# Patient Record
Sex: Female | Born: 1935 | Race: White | Hispanic: No | Marital: Married | Smoking: Former smoker
Health system: Southern US, Community
[De-identification: ages and names within clinical notes are randomized; demographics above are authoritative.]

## PROBLEM LIST (undated history)

## (undated) DIAGNOSIS — M1990 Unspecified osteoarthritis, unspecified site: Secondary | ICD-10-CM

## (undated) DIAGNOSIS — J3089 Other allergic rhinitis: Secondary | ICD-10-CM

## (undated) DIAGNOSIS — J449 Chronic obstructive pulmonary disease, unspecified: Secondary | ICD-10-CM

## (undated) DIAGNOSIS — E079 Disorder of thyroid, unspecified: Secondary | ICD-10-CM

## (undated) DIAGNOSIS — E785 Hyperlipidemia, unspecified: Secondary | ICD-10-CM

## (undated) DIAGNOSIS — G629 Polyneuropathy, unspecified: Secondary | ICD-10-CM

## (undated) DIAGNOSIS — I10 Essential (primary) hypertension: Secondary | ICD-10-CM

## (undated) HISTORY — PX: ABDOMINAL HYSTERECTOMY: SHX81

## (undated) HISTORY — DX: Disorder of thyroid, unspecified: E07.9

## (undated) HISTORY — DX: Hyperlipidemia, unspecified: E78.5

## (undated) HISTORY — DX: Essential (primary) hypertension: I10

## (undated) HISTORY — PX: APPENDECTOMY: SHX54

## (undated) HISTORY — DX: Chronic obstructive pulmonary disease, unspecified: J44.9

## (undated) HISTORY — DX: Polyneuropathy, unspecified: G62.9

## (undated) HISTORY — PX: TONSILLECTOMY AND ADENOIDECTOMY: SUR1326

## (undated) HISTORY — DX: Other allergic rhinitis: J30.89

## (undated) HISTORY — DX: Unspecified osteoarthritis, unspecified site: M19.90

## (undated) HISTORY — PX: CHOLECYSTECTOMY: SHX55

---

## 2014-05-01 HISTORY — PX: REPLACEMENT TOTAL KNEE: SUR1224

## 2017-11-22 ENCOUNTER — Encounter (INDEPENDENT_AMBULATORY_CARE_PROVIDER_SITE_OTHER): Payer: Self-pay

## 2017-11-22 ENCOUNTER — Other Ambulatory Visit: Payer: Self-pay | Admitting: Primary Care

## 2017-11-22 ENCOUNTER — Ambulatory Visit: Payer: Medicare Other | Admitting: Primary Care

## 2017-11-22 ENCOUNTER — Encounter: Payer: Self-pay | Admitting: Primary Care

## 2017-11-22 VITALS — BP 148/82 | HR 59 | Temp 97.4°F | Ht 63.5 in | Wt 156.2 lb

## 2017-11-22 DIAGNOSIS — I10 Essential (primary) hypertension: Secondary | ICD-10-CM

## 2017-11-22 DIAGNOSIS — E785 Hyperlipidemia, unspecified: Secondary | ICD-10-CM | POA: Diagnosis not present

## 2017-11-22 DIAGNOSIS — E039 Hypothyroidism, unspecified: Secondary | ICD-10-CM

## 2017-11-22 DIAGNOSIS — G629 Polyneuropathy, unspecified: Secondary | ICD-10-CM

## 2017-11-22 DIAGNOSIS — M150 Primary generalized (osteo)arthritis: Secondary | ICD-10-CM

## 2017-11-22 DIAGNOSIS — Z1239 Encounter for other screening for malignant neoplasm of breast: Secondary | ICD-10-CM

## 2017-11-22 DIAGNOSIS — Z1231 Encounter for screening mammogram for malignant neoplasm of breast: Secondary | ICD-10-CM

## 2017-11-22 DIAGNOSIS — M159 Polyosteoarthritis, unspecified: Secondary | ICD-10-CM

## 2017-11-22 DIAGNOSIS — M1990 Unspecified osteoarthritis, unspecified site: Secondary | ICD-10-CM | POA: Insufficient documentation

## 2017-11-22 DIAGNOSIS — M8949 Other hypertrophic osteoarthropathy, multiple sites: Secondary | ICD-10-CM

## 2017-11-22 LAB — LIPID PANEL
CHOL/HDL RATIO: 3
Cholesterol: 145 mg/dL (ref 0–200)
HDL: 47.6 mg/dL (ref >39.00)
LDL Cholesterol: 79 mg/dL (ref 0–99)
NONHDL: 97.2
TRIGLYCERIDES: 90 mg/dL (ref 0.0–149.0)
VLDL: 18 mg/dL (ref 0.0–40.0)

## 2017-11-22 LAB — COMPREHENSIVE METABOLIC PANEL
ALT: 16 U/L (ref 0–35)
AST: 15 U/L (ref 0–37)
Albumin: 4.1 g/dL (ref 3.5–5.2)
Alkaline Phosphatase: 61 U/L (ref 39–117)
BILIRUBIN TOTAL: 0.4 mg/dL (ref 0.2–1.2)
BUN: 20 mg/dL (ref 6–23)
CALCIUM: 9.1 mg/dL (ref 8.4–10.5)
CO2: 29 meq/L (ref 19–32)
CREATININE: 0.54 mg/dL (ref 0.40–1.20)
Chloride: 107 mEq/L (ref 96–112)
GFR: 114.48 mL/min (ref >60.00)
Glucose, Bld: 96 mg/dL (ref 70–99)
Potassium: 3.9 mEq/L (ref 3.5–5.1)
Sodium: 142 mEq/L (ref 135–145)
TOTAL PROTEIN: 7 g/dL (ref 6.0–8.3)

## 2017-11-22 LAB — TSH: TSH: 0.34 u[IU]/mL — ABNORMAL LOW (ref 0.35–4.50)

## 2017-11-22 NOTE — Assessment & Plan Note (Signed)
Borderline high on recheck. Endorses compliance to losartan.  Will have her start monitoring BP at home, report readings at or above 150/90. BMP pending.

## 2017-11-22 NOTE — Assessment & Plan Note (Signed)
Located to bilateral knees and also right shoulder. Discussed treatment for shoulder including PT vs Sports Med eval, she opts for sports med eval first. She is open to PT. She will schedule an appointment.

## 2017-11-22 NOTE — Progress Notes (Signed)
Subjective:    Patient ID: Michelle Ball, female    DOB: 06-Feb-1933, 84 y.o.   MRN: [id number]  HPI  [name] is an 84 year old female who presents today to establish care and discuss the problems mentioned below. Will obtain old records. Her last physical was in December 2018. She due for her mammogram.   1) Hypothyroidism: Diagnosed during early teenage years. Currently managed on levothyroxine 88 mcg. Her TSH was checked last December 2018. Her last dose change was in 2010.  2) Hyperlipidemia: Currently managed on atorvastatin 20 mg. Her last lipid panel was in December 2018. She thinks it was normal.  3) Essential Hypertension: Currently managed on losartan 100 mg. She does not check her BP at home. She doesn't think her BP is ever this high. She denies chest pain, dizziness.   BP Readings from Last 3 Encounters:  11/22/17 (!) 152/94   4) Neuropathy: Currently managed on Lyrica 150 mg BID. She was following with neurology in [place]. She was once managed on Gabapentin in the past which didn't help. Overall she feels well managed.   5) Osteoarthritis: Located to her right shoulder and bilateral knees. Diagnosed with "frozen shoulder" in September 2018. She has decrease in ROM with pain when dressing, stretching, sleeping on her right side. She is taking Meloxicam nightly with improvement. She has a handicap placard for which is needing renewal. Due to arthritis she is unable to walk long distances.   Review of Systems  Eyes: Negative for visual disturbance.  Respiratory: Negative for shortness of breath.   Cardiovascular: Negative for chest pain.  Musculoskeletal: Positive for arthralgias.       Chronic right shoulder pain  Neurological: Negative for dizziness and headaches.       Neuropathy        Past Medical History:  Diagnosis Date  . Allergy   . Arthritis   . COPD (chronic obstructive pulmonary disease) ([place])   . Hyperlipidemia   . Hypertension   .  Thyroid disease      Social History   Socioeconomic History  . Marital status: Married    Spouse name: Not on file  . Number of children: Not on file  . Years of education: Not on file  . Highest education level: Not on file  Occupational History  . Not on file  Social Needs  . Financial resource strain: Not on file  . Food insecurity:    Worry: Not on file    Inability: Not on file  . Transportation needs:    Medical: Not on file    Non-medical: Not on file  Tobacco Use  . Smoking status: Former Research scientist (life sciences)  . Smokeless tobacco: Never Used  Substance and Sexual Activity  . Alcohol use: Never    Frequency: Never  . Drug use: Never  . Sexual activity: Not Currently  Lifestyle  . Physical activity:    Days per week: Not on file    Minutes per session: Not on file  . Stress: Not on file  Relationships  . Social connections:    Talks on phone: Not on file    Gets together: Not on file    Attends religious service: Not on file    Active member of club or organization: Not on file    Attends meetings of clubs or organizations: Not on file    Relationship status: Not on file  . Intimate partner violence:    Fear of current or ex  partner: Not on file    Emotionally abused: Not on file    Physically abused: Not on file    Forced sexual activity: Not on file  Other Topics Concern  . Not on file  Social History Narrative  . Not on file      Family History  Problem Relation Age of Onset  . Arthritis Mother   . Cancer Mother   . Arthritis Father   . Early death Father   . Heart disease Father   . Heart attack Father   . Hyperlipidemia Father   . Hypertension Father   . Arthritis Sister   . Depression Sister   . Hypertension Sister   . Hyperlipidemia Sister     No Known Allergies  Current Outpatient Medications on File Prior to Visit  Medication Sig Dispense Refill  . atorvastatin (LIPITOR) 20 MG tablet Take 20 mg by mouth daily.  0  . levothyroxine  (SYNTHROID, LEVOTHROID) 88 MCG tablet Take 88 mcg by mouth daily.    Marland Kitchen losartan (COZAAR) 100 MG tablet Take 100 mg by mouth daily.  0  . LYRICA 150 MG capsule Take 150 mg by mouth daily.  1  . meloxicam (MOBIC) 15 MG tablet TK 1 T PO QD WF PRN  1   No current facility-administered medications on file prior to visit.     BP (!) 148/82   Pulse (!) 59   Temp (!) 97.4 F (36.3 C) (Oral)   Ht 5' 3.5" (1.613 m)   Wt 156 lb 4 oz (70.9 kg)   SpO2 97%   BMI 27.24 kg/m    Objective:   Physical Exam  Constitutional: She appears well-nourished.  Neck: Neck supple.  Cardiovascular: Normal rate and regular rhythm.  Respiratory: Effort normal and breath sounds normal.  Musculoskeletal:       Right shoulder: She exhibits decreased range of motion and pain. She exhibits no tenderness.  Decrease in ROM with pain in most planes.   Skin: Skin is warm and dry.  Psychiatric: She has a normal mood and affect.           Assessment & Plan:

## 2017-11-22 NOTE — Assessment & Plan Note (Signed)
Compliant to atorvastatin, lipid panel pending. Continue same.  

## 2017-11-22 NOTE — Assessment & Plan Note (Signed)
TSH pending. Continue levothyroxine 88 mcg for now.

## 2017-11-22 NOTE — Assessment & Plan Note (Signed)
Chronic for years, doing well on Lyrica 150 mg BID. No improvement with gabapentin. Continue same.

## 2017-11-22 NOTE — Patient Instructions (Signed)
Start monitoring your blood pressure daily, around the same time of day, for the next several weeks.  Ensure that you have rested for 30 minutes prior to checking your blood pressure. Record your readings and I'll call you for those readings in 2 weeks.  Your blood pressure should run less than 150/90.   Stop by the lab prior to leaving today. I will notify you of your results once received.   Schedule an appointment with Dr. Lorelei Pont for evaluation of your shoulder.   Please schedule a physical with me in December this year. You may also schedule a lab only appointment 3-4 days prior. We will discuss your lab results in detail during your physical.  It was a pleasure to meet you today! Please don't hesitate to call or message me with any questions. Welcome to [facility]!

## 2017-11-26 ENCOUNTER — Telehealth: Payer: Self-pay | Admitting: Primary Care

## 2017-11-26 NOTE — Telephone Encounter (Signed)
Rec'd from [facility] forward  31 pages to Dr. Alma Friendly

## 2017-11-28 ENCOUNTER — Encounter: Payer: Self-pay | Admitting: Family Medicine

## 2017-11-28 ENCOUNTER — Ambulatory Visit: Payer: Medicare Other | Admitting: Family Medicine

## 2017-11-28 VITALS — BP 158/70 | HR 71 | Temp 98.4°F | Ht 63.5 in | Wt 158.0 lb

## 2017-11-28 DIAGNOSIS — M25512 Pain in left shoulder: Secondary | ICD-10-CM | POA: Diagnosis not present

## 2017-11-28 DIAGNOSIS — M19012 Primary osteoarthritis, left shoulder: Secondary | ICD-10-CM | POA: Diagnosis not present

## 2017-11-28 MED ORDER — METHYLPREDNISOLONE ACETATE 40 MG/ML IJ SUSP
80.0000 mg | Freq: Once | INTRAMUSCULAR | Status: AC
  Administered 2017-11-28: 80 mg via INTRA_ARTICULAR

## 2017-11-28 NOTE — Progress Notes (Signed)
Dr. Frederico Hamman T. Shavette Shoaff, MD, [facility] [place], [address] Phone: [phone] Fax: [phone]  11/28/2017  Patient: Michelle Ball, MRN: [id number], DOB: 02/10/1933, 84 y.o.  Primary Physician:  Pleas Koch, NP   Chief Complaint  Patient presents with  . Shoulder Pain    Pain  . Ears Popping   Subjective:   Michelle Ball is a 84 y.o. very pleasant female patient who presents with the following:  R shoulder, GH OA.  Pleasant 84 year old who is recently moved to the region from [place], and she was previously evaluated in [facility] when she was having some pain in her right shoulder.  At that point she had both a traditional shoulder series as well as a CT of her shoulder, and this showed that the patient had some moderate glenohumeral arthritis.  There was no evidence of neoplasm, and she has not had any known injury at all.  Only intervention she has had is some meloxicam at 7.5 mg, no injections, no physical therapy, no surgery.  Had a cancer removed but has some degenerative pain and pain in the R shoulder and limited motion. Hurts if hurts to move further. Has not done too much.  R GH injection  Past Medical History, Surgical History, Social History, Family History, Problem List, Medications, and Allergies have been reviewed and updated if relevant.  Patient Active Problem List   Diagnosis Date Noted  . Hypothyroidism 11/22/2017  . Hyperlipidemia 11/22/2017  . Essential hypertension 11/22/2017  . Neuropathy 11/22/2017  . Osteoarthritis 11/22/2017    Past Medical History:  Diagnosis Date  . Arthritis   . COPD (chronic obstructive pulmonary disease) ([place])   . Environmental and seasonal allergies   . Hyperlipidemia   . Hypertension   . Neuropathy   . Thyroid disease     Past Surgical History:  Procedure Laterality Date  . ABDOMINAL HYSTERECTOMY    . APPENDECTOMY      . CHOLECYSTECTOMY    . REPLACEMENT TOTAL KNEE Right 2016  . TONSILLECTOMY AND ADENOIDECTOMY      Social History   Socioeconomic History  . Marital status: Married    Spouse name: Not on file  . Number of children: Not on file  . Years of education: Not on file  . Highest education level: Not on file  Occupational History  . Not on file  Social Needs  . Financial resource strain: Not on file  . Food insecurity:    Worry: Not on file    Inability: Not on file  . Transportation needs:    Medical: Not on file    Non-medical: Not on file  Tobacco Use  . Smoking status: Former Research scientist (life sciences)  . Smokeless tobacco: Never Used  Substance and Sexual Activity  . Alcohol use: Never    Frequency: Never  . Drug use: Never  . Sexual activity: Not Currently  Lifestyle  . Physical activity:    Days per week: Not on file    Minutes per session: Not on file  . Stress: Not on file  Relationships  . Social connections:    Talks on phone: Not on file    Gets together: Not on file    Attends religious service: Not on file    Active member of club or organization: Not on file    Attends meetings of clubs or organizations: Not on file    Relationship status: Not on file  .  Intimate partner violence:    Fear of current or ex partner: Not on file    Emotionally abused: Not on file    Physically abused: Not on file    Forced sexual activity: Not on file  Other Topics Concern  . Not on file  Social History Narrative  . Not on file    Family History  Problem Relation Age of Onset  . Arthritis Mother   . Cancer Mother   . Arthritis Father   . Early death Father   . Heart disease Father   . Heart attack Father   . Hyperlipidemia Father   . Hypertension Father   . Arthritis Sister   . Depression Sister   . Hypertension Sister   . Hyperlipidemia Sister     No Known Allergies  Medication list reviewed and updated in full in [place].  GEN: No fevers, chills. Nontoxic.  Primarily MSK c/o today. MSK: Detailed in the HPI GI: tolerating PO intake without difficulty Neuro: No numbness, parasthesias, or tingling associated. Otherwise the pertinent positives of the ROS are noted above.   Objective:   BP (!) 158/70   Pulse 71   Temp 98.4 F (36.9 C) (Oral)   Ht 5' 3.5" (1.613 m)   Wt 158 lb (71.7 kg)   BMI 27.55 kg/m    GEN: WDWN, NAD, Non-toxic, Alert & Oriented x 3 HEENT: Atraumatic, Normocephalic.  Ears and Nose: No external deformity. EXTR: No clubbing/cyanosis/edema NEURO: Normal gait.  PSYCH: Normally interactive. Conversant. Not depressed or anxious appearing.  Calm demeanor.   Shoulder: R and L Inspection: No muscle wasting or winging Ecchymosis/edema: neg  AC joint, scapula, clavicle: NT Cervical spine: NT, full ROM Spurling's: neg ABNORMAL SIDE TESTED: R UNLESS OTHERWISE NOTED, THE CONTRALATERAL SIDE HAS FULL RANGE OF MOTION. Abduction: 5/5, LIMITED TO 140 DEGREES Flexion: 5/5, LIMITED TO 140 DEGNO ROM  IR, lift-off: 5/5. TESTED AT 90 DEGREES OF ABDUCTION, LIMITED TO 0 DEGREES ER at neutral:  5/5, TESTED AT 90 DEGREES OF ABDUCTION, LIMITED TO 80 DEGREES AC crossover and compression: PAIN Drop Test: neg Empty Can: neg Supraspinatus insertion: NT Bicipital groove: NT ALL OTHER SPECIAL TESTING EQUIVOCAL GIVEN LOSS OF MOTION C5-T1 intact Sensation intact Grip 5/5    Radiology: No results found.  Assessment and Plan:   Osteoarthritis of glenohumeral joint, left  Left shoulder pain, unspecified chronicity - Plan: methylPREDNISolone acetate (DEPO-MEDROL) injection 80 mg  I do not have the films for my independent review, but I think that you can assume that the reading radiologist and orthopedic surgeons would be able to define the patient's glenohumeral osteoarthritis.  It is my assumption based on her distribution of pain and films that this is the likely cause of her loss of motion as well as pain deep in the glenohumeral  joint.  She has some loss of motion, which is often seen in glenohumeral arthritis.  A component of adhesive capsulitis may be present.  [facility] protocol given for ROM.  Intrarticular Shoulder Injection, LEFT Verbal consent was obtained from the patient. Risks including infection explained and contrasted with benefits and alternatives. Patient prepped with Chloraprep and Ethyl Chloride used for anesthesia. An intraarticular shoulder injection was performed using the posterior approach. The patient tolerated the procedure well and had decreased pain post injection. No complications. Injection: 8 cc of Lidocaine 1% and 2 mL Depo-Medrol 40 mg. Needle: 22 gauge 2 inch  Follow-up: 2 months if needed  Meds ordered this encounter  Medications  .  methylPREDNISolone acetate (DEPO-MEDROL) injection 80 mg   Signed,  Latash Nouri T. Elaura Calix, MD   Allergies as of 11/28/2017   No Known Allergies     Medication List        Accurate as of 11/28/17  2:24 PM. Always use your most recent med list.          atorvastatin 20 MG tablet Commonly known as:  LIPITOR Take 20 mg by mouth daily.   levothyroxine 88 MCG tablet Commonly known as:  SYNTHROID, LEVOTHROID Take 88 mcg by mouth daily.   losartan 100 MG tablet Commonly known as:  COZAAR Take 100 mg by mouth daily.   LYRICA 150 MG capsule Generic drug:  pregabalin Take 150 mg by mouth daily.   meloxicam 15 MG tablet Commonly known as:  MOBIC TK 1 T PO QD WF PRN

## 2017-12-14 ENCOUNTER — Ambulatory Visit
Admission: RE | Admit: 2017-12-14 | Discharge: 2017-12-14 | Disposition: A | Discharge status: Home / Self Care | Payer: Medicare Other | Source: Ambulatory Visit | Attending: Primary Care | Admitting: Primary Care

## 2017-12-14 DIAGNOSIS — Z1231 Encounter for screening mammogram for malignant neoplasm of breast: Secondary | ICD-10-CM | POA: Insufficient documentation

## 2017-12-14 DIAGNOSIS — Z1239 Encounter for other screening for malignant neoplasm of breast: Secondary | ICD-10-CM

## 2017-12-18 ENCOUNTER — Other Ambulatory Visit: Payer: Self-pay | Admitting: *Deleted

## 2017-12-18 ENCOUNTER — Inpatient Hospital Stay
Admission: RE | Admit: 2017-12-18 | Discharge: 2017-12-18 | Disposition: A | Discharge status: Home / Self Care | Payer: Self-pay | Source: Ambulatory Visit | Attending: *Deleted | Admitting: *Deleted

## 2017-12-18 DIAGNOSIS — Z9289 Personal history of other medical treatment: Secondary | ICD-10-CM

## 2017-12-21 ENCOUNTER — Other Ambulatory Visit: Payer: Medicare Other

## 2017-12-28 ENCOUNTER — Other Ambulatory Visit (INDEPENDENT_AMBULATORY_CARE_PROVIDER_SITE_OTHER): Payer: Medicare Other

## 2017-12-28 DIAGNOSIS — E039 Hypothyroidism, unspecified: Secondary | ICD-10-CM | POA: Diagnosis not present

## 2017-12-28 LAB — TSH: TSH: 0.43 u[IU]/mL (ref 0.35–4.50)

## 2018-01-21 ENCOUNTER — Other Ambulatory Visit: Payer: Self-pay | Admitting: Primary Care

## 2018-01-22 ENCOUNTER — Ambulatory Visit (INDEPENDENT_AMBULATORY_CARE_PROVIDER_SITE_OTHER): Payer: Medicare Other

## 2018-01-22 VITALS — BP 110/78 | HR 62 | Temp 97.7°F | Ht 64.0 in | Wt 152.5 lb

## 2018-01-22 DIAGNOSIS — Z23 Encounter for immunization: Secondary | ICD-10-CM | POA: Diagnosis not present

## 2018-01-22 DIAGNOSIS — Z0000 Encounter for general adult medical examination without abnormal findings: Secondary | ICD-10-CM

## 2018-01-22 NOTE — Patient Instructions (Signed)
Michelle Ball , Thank you for taking time to come for your Medicare Wellness Visit. I appreciate your ongoing commitment to your health goals. Please review the following plan we discussed and let me know if I can assist you in the future.   These are the goals we discussed: Goals    . Patient Stated     Starting 01/22/2018, I will continue to take medications as prescribed.        This is a list of the screening recommended for you and due dates:  Health Maintenance  Topic Date Due  . DEXA scan (bone density measurement)  05/01/2019*  . Tetanus Vaccine  01/09/2022  . Flu Shot  Completed  . Pneumonia vaccines  Completed  *Topic was postponed. The date shown is not the original due date.   Preventive Care for Adults  A healthy lifestyle and preventive care can promote health and wellness. Preventive health guidelines for adults include the following key practices.  . A routine yearly physical is a good way to check with your health care provider about your health and preventive screening. It is a chance to share any concerns and updates on your health and to receive a thorough exam.  . Visit your dentist for a routine exam and preventive care every 6 months. Brush your teeth twice a day and floss once a day. Good oral hygiene prevents tooth decay and gum disease.  . The frequency of eye exams is based on your age, health, family medical history, use  of contact lenses, and other factors. Follow your health care provider's recommendations for frequency of eye exams.  . Eat a healthy diet. Foods like vegetables, fruits, whole grains, low-fat dairy products, and lean protein foods contain the nutrients you need without too many calories. Decrease your intake of foods high in solid fats, added sugars, and salt. Eat the right amount of calories for you. Get information about a proper diet from your health care provider, if necessary.  . Regular physical exercise is one of the most  important things you can do for your health. Most adults should get at least 150 minutes of moderate-intensity exercise (any activity that increases your heart rate and causes you to sweat) each week. In addition, most adults need muscle-strengthening exercises on 2 or more days a week.  Silver Sneakers may be a benefit available to you. To determine eligibility, you may visit the website: www.silversneakers.com or contact program at 340-860-4528 Mon-Fri between 8AM-8PM.   . Maintain a healthy weight. The body mass index (BMI) is a screening tool to identify possible weight problems. It provides an estimate of body fat based on height and weight. Your health care provider can find your BMI and can help you achieve or maintain a healthy weight.   For adults 20 years and older: ? A BMI below 18.5 is considered underweight. ? A BMI of 18.5 to 24.9 is normal. ? A BMI of 25 to 29.9 is considered overweight. ? A BMI of 30 and above is considered obese.   . Maintain normal blood lipids and cholesterol levels by exercising and minimizing your intake of saturated fat. Eat a balanced diet with plenty of fruit and vegetables. Blood tests for lipids and cholesterol should begin at age 33 and be repeated every 5 years. If your lipid or cholesterol levels are high, you are over 50, or you are at high risk for heart disease, you may need your cholesterol levels checked more frequently. Ongoing high  lipid and cholesterol levels should be treated with medicines if diet and exercise are not working.  . If you smoke, find out from your health care provider how to quit. If you do not use tobacco, please do not start.  . If you choose to drink alcohol, please do not consume more than 2 drinks per day. One drink is considered to be 12 ounces (355 mL) of beer, 5 ounces (148 mL) of wine, or 1.5 ounces (44 mL) of liquor.  . If you are 35-1 years old, ask your health care provider if you should take aspirin to prevent  strokes.  . Use sunscreen. Apply sunscreen liberally and repeatedly throughout the day. You should seek shade when your shadow is shorter than you. Protect yourself by wearing long sleeves, pants, a wide-brimmed hat, and sunglasses year round, whenever you are outdoors.  . Once a month, do a whole body skin exam, using a mirror to look at the skin on your back. Tell your health care provider of new moles, moles that have irregular borders, moles that are larger than a pencil eraser, or moles that have changed in shape or color.

## 2018-01-22 NOTE — Progress Notes (Signed)
PCP notes:   Health maintenance:  Flu vaccine - administered  Abnormal screenings:   None  Patient concerns:   Increasing frequency of leg cramps  Limited ROM in right arm  Nurse concerns:  None  Next PCP appt:   01/29/18 @ 0940

## 2018-01-22 NOTE — Progress Notes (Signed)
Subjective:   Michelle Ball is a 84 y.o. female who presents for Medicare Annual (Subsequent) preventive examination.  Review of Systems:  N/A Cardiac Risk Factors include: advanced age (>66men, >6 women);dyslipidemia;Other (see comment)     Objective:     Vitals: BP 110/78 (BP Location: Right Arm, Patient Position: Sitting, Cuff Size: Normal)   Pulse 62   Temp 97.7 F (36.5 C) (Oral)   Ht 5\' 4"  (1.626 m) Comment: shoes  Wt 152 lb 8 oz (69.2 kg)   SpO2 95%   BMI 26.18 kg/m   Body mass index is 26.18 kg/m.  Advanced Directives 01/22/2018  Does Patient Have a Medical Advance Directive? Yes  Type of Paramedic of [place];Living will  Copy of [place] in Chart? No - copy requested    Tobacco Social History   Tobacco Use  Smoking Status Former Smoker  Smokeless Tobacco Never Used     Counseling given: No   Clinical Intake:  Pre-visit preparation completed: Yes  Pain : No/denies pain Pain Score: 0-No pain     Nutritional Status: BMI 25 -29 Overweight Nutritional Risks: None Diabetes: No  How often do you need to have someone help you when you read instructions, pamphlets, or other written materials from your doctor or pharmacy?: 1 - Never What is the last grade level you completed in school?: Master degrees (2)  Interpreter Needed?: No  Comments: pt lives with spouse Information entered by :: LPinson, LPN  Past Medical History:  Diagnosis Date  . Arthritis   . COPD (chronic obstructive pulmonary disease) ([place])   . Environmental and seasonal allergies   . Hyperlipidemia   . Hypertension   . Neuropathy   . Thyroid disease    Past Surgical History:  Procedure Laterality Date  . ABDOMINAL HYSTERECTOMY    . APPENDECTOMY    . CHOLECYSTECTOMY    . REPLACEMENT TOTAL KNEE Right 2016  . TONSILLECTOMY AND ADENOIDECTOMY     Family History  Problem Relation Age of Onset  . Arthritis Mother   . Cancer  Mother   . Arthritis Father   . Early death Father   . Heart disease Father   . Heart attack Father   . Hyperlipidemia Father   . Hypertension Father   . Arthritis Sister   . Depression Sister   . Hypertension Sister   . Hyperlipidemia Sister    Social History   Socioeconomic History  . Marital status: Married    Spouse name: Not on file  . Number of children: Not on file  . Years of education: Not on file  . Highest education level: Not on file  Occupational History  . Not on file  Social Needs  . Financial resource strain: Not on file  . Food insecurity:    Worry: Not on file    Inability: Not on file  . Transportation needs:    Medical: Not on file    Non-medical: Not on file  Tobacco Use  . Smoking status: Former Research scientist (life sciences)  . Smokeless tobacco: Never Used  Substance and Sexual Activity  . Alcohol use: Never    Frequency: Never  . Drug use: Never  . Sexual activity: Not Currently  Lifestyle  . Physical activity:    Days per week: Not on file    Minutes per session: Not on file  . Stress: Not on file  Relationships  . Social connections:    Talks on phone: Not on file  Gets together: Not on file    Attends religious service: Not on file    Active member of club or organization: Not on file    Attends meetings of clubs or organizations: Not on file    Relationship status: Not on file  Other Topics Concern  . Not on file  Social History Narrative  . Not on file    Outpatient Encounter Medications as of 01/22/2018  Medication Sig  . atorvastatin (LIPITOR) 20 MG tablet Take 20 mg by mouth daily.  Marland Kitchen levothyroxine (SYNTHROID, LEVOTHROID) 88 MCG tablet Take 88 mcg by mouth daily.  Marland Kitchen losartan (COZAAR) 100 MG tablet Take 100 mg by mouth daily.  Marland Kitchen LYRICA 150 MG capsule Take 150 mg by mouth daily.  . meloxicam (MOBIC) 15 MG tablet TK 1 T PO QD WF PRN  . Multiple Vitamins-Minerals (PRESERVISION AREDS 2 PO) Take 1 capsule by mouth 2 (two) times daily.   No  facility-administered encounter medications on file as of 01/22/2018.     Activities of Daily Living In your present state of health, do you have any difficulty performing the following activities: 01/22/2018  Hearing? N  Vision? N  Difficulty concentrating or making decisions? N  Walking or climbing stairs? Y  Dressing or bathing? N  Doing errands, shopping? N  Preparing Food and eating ? N  Using the Toilet? N  In the past six months, have you accidently leaked urine? N  Do you have problems with loss of bowel control? N  Managing your Medications? N  Managing your Finances? N  Housekeeping or managing your Housekeeping? N  Some recent data might be hidden    Patient Care Team: Pleas Koch, NP as PCP - General (Internal Medicine)    Assessment:   This is a routine wellness examination for Saidee.  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 Patient Stated     Starting 01/22/2018, I will continue to take medications as prescribed.        Fall Risk Fall Risk  01/22/2018 11/22/2017  Falls in the past year? No No   Depression Screen PHQ 2/9 Scores 01/22/2018  PHQ - 2 Score 0  PHQ- 9 Score 0     Cognitive Function MMSE - Mini Mental State Exam 01/22/2018  Orientation to time 5  Orientation to Place 5  Registration 3  Attention/ Calculation 0  Recall 3  Language- name 2 objects 0  Language- repeat 1  Language- follow 3 step command 3  Language- read & follow direction 0  Write a sentence 0  Copy design 0  Total score 20       PLEASE NOTE: A Mini-Cog screen was completed. Maximum score is 20. A value of 0 denotes this part of [place] MMSE was not completed or the patient failed this part of the Mini-Cog screening.   Mini-Cog Screening Orientation to Time - Max 5  pts Orientation to Place - Max 5 pts Registration - Max 3 pts Recall - Max 3 pts Language Repeat - Max 1 pts Language Follow 3 Step Command - Max 3 pts   Immunization History  Administered Date(s) Administered  . Influenza,inj,Quad PF,6+ Mos 01/22/2018  . Influenza-Unspecified 01/24/2017  . Pneumococcal Conjugate-13 07/29/2013  . Pneumococcal Polysaccharide-23 03/31/2015  . Pneumococcal-Unspecified 05/01/2013  . Td 01/10/2012  . Zoster 05/03/2009  Screening Tests Health Maintenance  Topic Date Due  . DEXA SCAN  05/01/2019 (Originally 06/25/1999)  . TETANUS/TDAP  01/09/2022  . INFLUENZA VACCINE  Completed  . PNA vac Low Risk Adult  Completed      Plan:     I have personally reviewed, addressed, and noted the following in the patient's chart:  A. Medical and social history B. Use of alcohol, tobacco or illicit drugs  C. Current medications and supplements D. Functional ability and status E.  Nutritional status F.  Physical activity G. Advance directives H. List of other physicians I.  Hospitalizations, surgeries, and ER visits in previous 12 months J.  [place] to include hearing, vision, cognitive, depression L. Referrals and appointments - none  In addition, I have reviewed and discussed with patient certain preventive protocols, quality metrics, and best practice recommendations. A written personalized care plan for preventive services as well as general preventive health recommendations were provided to patient.  See attached scanned questionnaire for additional information.   Signed,   Lindell Noe, MHA, BS, LPN Health Coach

## 2018-01-23 NOTE — Progress Notes (Signed)
I reviewed health advisor's note, was available for consultation, and agree with documentation and plan.  

## 2018-01-29 ENCOUNTER — Encounter (INDEPENDENT_AMBULATORY_CARE_PROVIDER_SITE_OTHER): Payer: Self-pay

## 2018-01-29 ENCOUNTER — Encounter: Payer: Self-pay | Admitting: Primary Care

## 2018-01-29 ENCOUNTER — Ambulatory Visit: Payer: Medicare Other | Admitting: Primary Care

## 2018-01-29 VITALS — BP 140/70 | HR 68 | Temp 98.0°F | Ht 64.0 in | Wt 155.8 lb

## 2018-01-29 DIAGNOSIS — G629 Polyneuropathy, unspecified: Secondary | ICD-10-CM

## 2018-01-29 DIAGNOSIS — Z0000 Encounter for general adult medical examination without abnormal findings: Secondary | ICD-10-CM | POA: Insufficient documentation

## 2018-01-29 DIAGNOSIS — I10 Essential (primary) hypertension: Secondary | ICD-10-CM

## 2018-01-29 DIAGNOSIS — H6123 Impacted cerumen, bilateral: Secondary | ICD-10-CM

## 2018-01-29 DIAGNOSIS — E2839 Other primary ovarian failure: Secondary | ICD-10-CM

## 2018-01-29 DIAGNOSIS — M150 Primary generalized (osteo)arthritis: Secondary | ICD-10-CM

## 2018-01-29 DIAGNOSIS — E039 Hypothyroidism, unspecified: Secondary | ICD-10-CM | POA: Diagnosis not present

## 2018-01-29 DIAGNOSIS — M8949 Other hypertrophic osteoarthropathy, multiple sites: Secondary | ICD-10-CM

## 2018-01-29 DIAGNOSIS — E785 Hyperlipidemia, unspecified: Secondary | ICD-10-CM

## 2018-01-29 DIAGNOSIS — M159 Polyosteoarthritis, unspecified: Secondary | ICD-10-CM

## 2018-01-29 NOTE — Assessment & Plan Note (Signed)
Doing well on Lyrica, continue same.

## 2018-01-29 NOTE — Progress Notes (Signed)
Subjective:    Patient ID: Michelle Ball, female    DOB: [birth year], 84 y.o.   MRN: [id number]  HPI  Michelle Ball is an 84 year old female who presents today for complete physical. She saw our health advisor last week.   Immunizations: -Tetanus: Completed in 2013 -Influenza: Completed this season -Pneumonia: Completed in 2016 -Shingles: Completed in 2011  Diet: She endorses a healthy diet.  Breakfast: Protein shake Lunch: Cheese, nuts, fruit, biscuits Dinner: Salad, meat, starch Snacks: Peanut butter crackers Desserts: Infrequently  Beverages: Water, coffee  Exercise: She is not exercising regularly. Active around her home. Eye exam: Completed one year ago.  Dental exam: Completes annually  Dexa: No recent exam Mammogram: Completed in 2019  BP Readings from Last 3 Encounters:  01/29/18 140/70  01/22/18 110/78  11/28/17 (!) 158/70      Review of Systems  Constitutional: Negative for unexpected weight change.  HENT: Negative for rhinorrhea.   Respiratory: Negative for cough and shortness of breath.   Cardiovascular: Negative for chest pain.  Gastrointestinal: Negative for constipation and diarrhea.  Genitourinary: Negative for difficulty urinating.  Musculoskeletal: Negative for arthralgias and myalgias.  Skin: Negative for rash.  Allergic/Immunologic: Negative for environmental allergies.  Neurological: Negative for dizziness, numbness and headaches.  Psychiatric/Behavioral:       Overall doing well, taking care of her chronically ill husband       Past Medical History:  Diagnosis Date  . Arthritis   . COPD (chronic obstructive pulmonary disease) ([place])   . Environmental and seasonal allergies   . Hyperlipidemia   . Hypertension   . Neuropathy   . Thyroid disease      Social History   Socioeconomic History  . Marital status: Married    Spouse name: Not on file  . Number of children: Not on file  . Years of education: Not on file  . Highest  education level: Not on file  Occupational History  . Not on file  Social Needs  . Financial resource strain: Not on file  . Food insecurity:    Worry: Not on file    Inability: Not on file  . Transportation needs:    Medical: Not on file    Non-medical: Not on file  Tobacco Use  . Smoking status: Former Research scientist (life sciences)  . Smokeless tobacco: Never Used  Substance and Sexual Activity  . Alcohol use: Never    Frequency: Never  . Drug use: Never  . Sexual activity: Not Currently  Lifestyle  . Physical activity:    Days per week: Not on file    Minutes per session: Not on file  . Stress: Not on file  Relationships  . Social connections:    Talks on phone: Not on file    Gets together: Not on file    Attends religious service: Not on file    Active member of club or organization: Not on file    Attends meetings of clubs or organizations: Not on file    Relationship status: Not on file  . Intimate partner violence:    Fear of current or ex partner: Not on file    Emotionally abused: Not on file    Physically abused: Not on file    Forced sexual activity: Not on file  Other Topics Concern  . Not on file  Social History Narrative  . Not on file    Past Surgical History:  Procedure Laterality Date  . ABDOMINAL HYSTERECTOMY    .  APPENDECTOMY    . CHOLECYSTECTOMY    . REPLACEMENT TOTAL KNEE Right 2016  . TONSILLECTOMY AND ADENOIDECTOMY      Family History  Problem Relation Age of Onset  . Arthritis Mother   . Cancer Mother   . Arthritis Father   . Early death Father   . Heart disease Father   . Heart attack Father   . Hyperlipidemia Father   . Hypertension Father   . Arthritis Sister   . Depression Sister   . Hypertension Sister   . Hyperlipidemia Sister     No Known Allergies  Current Outpatient Medications on File Prior to Visit  Medication Sig Dispense Refill  . atorvastatin (LIPITOR) 20 MG tablet Take 20 mg by mouth daily.  0  . levothyroxine (SYNTHROID,  LEVOTHROID) 88 MCG tablet Take 88 mcg by mouth daily.    Marland Kitchen losartan (COZAAR) 100 MG tablet Take 100 mg by mouth daily.  0  . LYRICA 150 MG capsule Take 150 mg by mouth daily.  1  . meloxicam (MOBIC) 15 MG tablet TK 1 T PO QD WF PRN  1  . Multiple Vitamins-Minerals (PRESERVISION AREDS 2 PO) Take 1 capsule by mouth 2 (two) times daily.     No current facility-administered medications on file prior to visit.     BP 140/70   Pulse 68   Temp 98 F (36.7 C) (Oral)   Ht 5\' 4"  (1.626 m)   Wt 155 lb 12 oz (70.6 kg)   SpO2 95%   BMI 26.73 kg/m    Objective:   Physical Exam  Constitutional: She is oriented to person, place, and time. She appears well-nourished.  HENT:  Mouth/Throat: No oropharyngeal exudate.  Bilateral cerumen impaction. TM post irrigation unremarkable.   Eyes: Pupils are equal, round, and reactive to light. EOM are normal.  Neck: Neck supple. No thyromegaly present.  Cardiovascular: Normal rate and regular rhythm.  Respiratory: Effort normal and breath sounds normal.  GI: Soft. Bowel sounds are normal. There is no tenderness.  Musculoskeletal: Normal range of motion.  Neurological: She is alert and oriented to person, place, and time.  Skin: Skin is warm and dry.  Psychiatric: She has a normal mood and affect.           Assessment & Plan:

## 2018-01-29 NOTE — Assessment & Plan Note (Signed)
Immunizations UTD. Bone density due, pending.  Mammogram UTD. Recommended to resume regular exercise, increase vegetables, fruit, whole grains, water.  Exam with bilateral cerumen impaction, otherwise unremarkable.  Labs from July and August stable.  Follow up in 1 year for CPE.

## 2018-01-29 NOTE — Assessment & Plan Note (Signed)
Lipid panel stable from July 2019, continue atorvastatin.

## 2018-01-29 NOTE — Patient Instructions (Signed)
Resume exercising. You should be getting 150 minutes of exercise weekly.  Continue to work on [employer]. Make sure to eat plenty of vegetables, fruit, whole grains, lean protein.   Ensure you are consuming 64 ounces of water daily.  We'll see you in one year for your annual exam or sooner if needed.  It was a pleasure to see you today!   Preventive Care 82 Years and Older, Female Preventive care refers to lifestyle choices and visits with your health care provider that can promote health and wellness. What does preventive care include?  A yearly physical exam. This is also called an annual well check.  Dental exams once or twice a year.  Routine eye exams. Ask your health care provider how often you should have your eyes checked.  Personal lifestyle choices, including: ? Daily care of your teeth and gums. ? Regular physical activity. ? Eating a healthy diet. ? Avoiding tobacco and drug use. ? Limiting alcohol use. ? Practicing safe sex. ? Taking low-dose aspirin every day. ? Taking vitamin and mineral supplements as recommended by your health care provider. What happens during an annual well check? The services and screenings done by your health care provider during your annual well check will depend on your age, overall health, lifestyle risk factors, and family history of disease. Counseling Your health care provider may ask you questions about your:  Alcohol use.  Tobacco use.  Drug use.  Emotional well-being.  Home and relationship well-being.  Sexual activity.  Eating habits.  History of falls.  Memory and ability to understand (cognition).  Work and work Statistician.  Reproductive health.  Screening You may have the following tests or measurements:  Height, weight, and BMI.  Blood pressure.  Lipid and cholesterol levels. These may be checked every 5 years, or more frequently if you are over 12 years old.  Skin check.  Lung cancer screening.  You may have this screening every year starting at age 19 if you have a 30-pack-year history of smoking and currently smoke or have quit within the past 15 years.  Fecal occult blood test (FOBT) of the stool. You may have this test every year starting at age 29.  Flexible sigmoidoscopy or colonoscopy. You may have a sigmoidoscopy every 5 years or a colonoscopy every 10 years starting at age 67.  Hepatitis C blood test.  Hepatitis B blood test.  Sexually transmitted disease (STD) testing.  Diabetes screening. This is done by checking your blood sugar (glucose) after you have not eaten for a while (fasting). You may have this done every 1-3 years.  Bone density scan. This is done to screen for osteoporosis. You may have this done starting at age 61.  Mammogram. This may be done every 1-2 years. Talk to your health care provider about how often you should have regular mammograms.  Talk with your health care provider about your test results, treatment options, and if necessary, the need for more tests. Vaccines Your health care provider may recommend certain vaccines, such as:  Influenza vaccine. This is recommended every year.  Tetanus, diphtheria, and acellular pertussis (Tdap, Td) vaccine. You may need a Td booster every 10 years.  Varicella vaccine. You may need this if you have not been vaccinated.  Zoster vaccine. You may need this after age 48.  Measles, mumps, and rubella (MMR) vaccine. You may need at least one dose of MMR if you were born in 1957 or later. You may also need a  second dose.  Pneumococcal 13-valent conjugate (PCV13) vaccine. One dose is recommended after age 60.  Pneumococcal polysaccharide (PPSV23) vaccine. One dose is recommended after age 44.  Meningococcal vaccine. You may need this if you have certain conditions.  Hepatitis A vaccine. You may need this if you have certain conditions or if you travel or work in places where you may be exposed to hepatitis  A.  Hepatitis B vaccine. You may need this if you have certain conditions or if you travel or work in places where you may be exposed to hepatitis B.  Haemophilus influenzae type b (Hib) vaccine. You may need this if you have certain conditions.  Talk to your health care provider about which screenings and vaccines you need and how often you need them. This information is not intended to replace advice given to you by your health care provider. Make sure you discuss any questions you have with your health care provider. Document Released: 05/14/2015 Document Revised: 01/05/2016 Document Reviewed: 02/16/2015 Elsevier Interactive Patient Education  [employer].

## 2018-01-29 NOTE — Assessment & Plan Note (Signed)
Stable in the office today, continue losartan. BMP unremarkable.

## 2018-01-29 NOTE — Assessment & Plan Note (Signed)
Overall stable, recommended she get back into regular exercise when possible.

## 2018-01-29 NOTE — Assessment & Plan Note (Signed)
Recent TSH unremarkable, continue levothyroxine 88 mcg.

## 2018-02-28 ENCOUNTER — Other Ambulatory Visit: Payer: Medicare Other

## 2018-04-10 ENCOUNTER — Ambulatory Visit: Payer: Medicare Other

## 2018-04-17 ENCOUNTER — Encounter: Payer: Medicare Other | Admitting: Primary Care

## 2018-05-22 ENCOUNTER — Encounter: Payer: Self-pay | Admitting: Primary Care

## 2018-05-22 ENCOUNTER — Ambulatory Visit: Payer: Medicare Other | Admitting: Primary Care

## 2018-05-22 DIAGNOSIS — I10 Essential (primary) hypertension: Secondary | ICD-10-CM | POA: Diagnosis not present

## 2018-05-22 NOTE — Progress Notes (Signed)
Subjective:    Patient ID: Michelle Ball, female    DOB: December 06, 1932, 85 y.o.   MRN: [id number]  HPI  Ms. Hazelbaker is an 85 year old female with a history of hypertension who presents today with a chief complaint of hypertension.  She is managed on losartan 100 mg. She visited the dentist 8 days ago and was found to have elevated systolic blood pressure reading of 140. She then left for a funeral several days later and was told by her daughter that she had an unsteady gait and seemed unsteady. She had a walker but doesn't use now.   She has put on 20 pounds since the loss of her husband in the Fall of 2019. She is also no longer exercising. She's not checking her BP at home but did bring her cuff in today for [place] to look.   BP Readings from Last 3 Encounters:  05/22/18 130/70  01/29/18 140/70  01/22/18 110/78     Review of Systems  Eyes: Negative for visual disturbance.  Cardiovascular: Negative for chest pain.  Neurological: Negative for dizziness, syncope and headaches.       Past Medical History:  Diagnosis Date  . Arthritis   . COPD (chronic obstructive pulmonary disease) ([place])   . Environmental and seasonal allergies   . Hyperlipidemia   . Hypertension   . Neuropathy   . Thyroid disease      Social History   Socioeconomic History  . Marital status: Married    Spouse name: Not on file  . Number of children: Not on file  . Years of education: Not on file  . Highest education level: Not on file  Occupational History  . Not on file  Social Needs  . Financial resource strain: Not on file  . Food insecurity:    Worry: Not on file    Inability: Not on file  . Transportation needs:    Medical: Not on file    Non-medical: Not on file  Tobacco Use  . Smoking status: Former Research scientist (life sciences)  . Smokeless tobacco: Never Used  Substance and Sexual Activity  . Alcohol use: Never    Frequency: Never  . Drug use: Never  . Sexual activity: Not Currently  Lifestyle  .  Physical activity:    Days per week: Not on file    Minutes per session: Not on file  . Stress: Not on file  Relationships  . Social connections:    Talks on phone: Not on file    Gets together: Not on file    Attends religious service: Not on file    Active member of club or organization: Not on file    Attends meetings of clubs or organizations: Not on file    Relationship status: Not on file  . Intimate partner violence:    Fear of current or ex partner: Not on file    Emotionally abused: Not on file    Physically abused: Not on file    Forced sexual activity: Not on file  Other Topics Concern  . Not on file  Social History Narrative  . Not on file    Past Surgical History:  Procedure Laterality Date  . ABDOMINAL HYSTERECTOMY    . APPENDECTOMY    . CHOLECYSTECTOMY    . REPLACEMENT TOTAL KNEE Right 2016  . TONSILLECTOMY AND ADENOIDECTOMY      Family History  Problem Relation Age of Onset  . Arthritis Mother   . Cancer Mother   .  Arthritis Father   . Early death Father   . Heart disease Father   . Heart attack Father   . Hyperlipidemia Father   . Hypertension Father   . Arthritis Sister   . Depression Sister   . Hypertension Sister   . Hyperlipidemia Sister     No Known Allergies  Current Outpatient Medications on File Prior to Visit  Medication Sig Dispense Refill  . atorvastatin (LIPITOR) 20 MG tablet Take 20 mg by mouth daily.  0  . levothyroxine (SYNTHROID, LEVOTHROID) 88 MCG tablet Take 88 mcg by mouth daily.    Marland Kitchen losartan (COZAAR) 100 MG tablet Take 100 mg by mouth daily.  0  . LYRICA 150 MG capsule Take 150 mg by mouth daily.  1  . meloxicam (MOBIC) 15 MG tablet TK 1 T PO QD WF PRN  1  . Multiple Vitamins-Minerals (PRESERVISION AREDS 2 PO) Take 1 capsule by mouth 2 (two) times daily.     No current facility-administered medications on file prior to visit.     BP 130/70   Pulse 72   Temp 98 F (36.7 C) (Oral)   Ht 5\' 4"  (1.626 m)   Wt 158 lb  12 oz (72 kg)   SpO2 95%   BMI 27.25 kg/m    Objective:   Physical Exam  Constitutional: She appears well-nourished.  Neck: Neck supple.  Cardiovascular: Normal rate and regular rhythm.  Respiratory: Effort normal and breath sounds normal.  Skin: Skin is warm and dry.           Assessment & Plan:

## 2018-05-22 NOTE — Patient Instructions (Addendum)
Continue losartan 100 mg for blood pressure.  Your blood pressure looks good today! Please return if you notice dizziness, visual changes, unsteady gait.   We will see you in October this year as scheduled.   It was a pleasure to see you today!

## 2018-05-22 NOTE — Assessment & Plan Note (Signed)
Stable in the office today, exam unremarkable.  Recommend she work on regular exercise and a healthy diet.  Continue losartan 100 mg.  Return precautions provided.

## 2018-05-29 ENCOUNTER — Telehealth: Payer: Self-pay | Admitting: Primary Care

## 2018-05-29 DIAGNOSIS — G629 Polyneuropathy, unspecified: Secondary | ICD-10-CM

## 2018-05-29 MED ORDER — LYRICA 150 MG PO CAPS: 150.0000 mg | ORAL_CAPSULE | Freq: Every day | ORAL | 0 refills | Status: DC

## 2018-05-29 NOTE — Telephone Encounter (Signed)
Rx have not been prescribed by Allie Bossier . Last office visit on 05/22/2018. Next future appointment on 01/27/2019 for AWV

## 2018-05-29 NOTE — Telephone Encounter (Signed)
Noted.  Refill sent to pharmacy. 

## 2018-05-29 NOTE — Telephone Encounter (Signed)
Pt called office requesting a refill on the Pregabalin. Pt states she is out of the medication and she uses gets a 90 day supply. Pt uses Holiday representative at [facility] [place], [place]

## 2018-05-31 NOTE — Telephone Encounter (Signed)
Okay per Anda Kraft to change Rx to 150 BID since patient have been taking this before she came to this office.  Per DPR, left detail message for patient to call office when current bottle is low and we will with new sig

## 2018-05-31 NOTE — Telephone Encounter (Signed)
Pt is taking Lyrica 150 mg and stated she is taking 2 pills a day and not one. Pt want to speak to nurse to make sure it's ok to take 2 and if so she need more pills. Please advise pt

## 2018-06-06 ENCOUNTER — Encounter: Payer: Self-pay | Admitting: Family Medicine

## 2018-06-06 ENCOUNTER — Ambulatory Visit: Payer: Medicare Other | Admitting: Family Medicine

## 2018-06-06 VITALS — BP 142/70 | HR 67 | Temp 97.9°F | Ht 64.0 in

## 2018-06-06 DIAGNOSIS — J029 Acute pharyngitis, unspecified: Secondary | ICD-10-CM | POA: Diagnosis not present

## 2018-06-06 NOTE — Assessment & Plan Note (Signed)
With pnd and throat clearing  Reassuring exam  Suspect early viral uri  Discussed sympt care Salt water gargle/analgesic prn  Expectorant with DM otc for cong/cough if needed Continue antihistamine Update if not starting to improve in a week or if worsening

## 2018-06-06 NOTE — Patient Instructions (Addendum)
I think your sore throat is from post nasal drip  You may be coming down with a cold  Drink lots of fluids and rest   Allegra may help the drip and nasal symptoms-continue it  If coughing- robitussin is good  Tylenol for pain or fever  Keep doing salt water gargles  Update if not starting to improve in a week or if worsening

## 2018-06-06 NOTE — Progress Notes (Signed)
Subjective:    Patient ID: Michelle Ball, female    DOB: [birth year], 85 y.o.   MRN: [id number]  HPI Here for sore throat   85 yo pt of NP Clark   Woke up this am - sore throat with hoarseness  Had a little ST yesterday  Gargling with salt water  Also drank coffee  Some better now   Clearing her throat a lot  Post nasal drip  Some stuffy/runny nose all the time   No fever  No aches or chills   No otc medicines except 1 tylenol at night  She takes an antihistamine - 180 mg allegra every night     Patient Active Problem List   Diagnosis Date Noted  . Sore throat 06/06/2018  . Preventative health care 01/29/2018  . Hypothyroidism 11/22/2017  . Hyperlipidemia 11/22/2017  . Essential hypertension 11/22/2017  . Neuropathy 11/22/2017  . Osteoarthritis 11/22/2017   Past Medical History:  Diagnosis Date  . Arthritis   . COPD (chronic obstructive pulmonary disease) ([place])   . Environmental and seasonal allergies   . Hyperlipidemia   . Hypertension   . Neuropathy   . Thyroid disease    Past Surgical History:  Procedure Laterality Date  . ABDOMINAL HYSTERECTOMY    . APPENDECTOMY    . CHOLECYSTECTOMY    . REPLACEMENT TOTAL KNEE Right 2016  . TONSILLECTOMY AND ADENOIDECTOMY     Social History   Tobacco Use  . Smoking status: Former Research scientist (life sciences)  . Smokeless tobacco: Never Used  Substance Use Topics  . Alcohol use: Never    Frequency: Never  . Drug use: Never   Family History  Problem Relation Age of Onset  . Arthritis Mother   . Cancer Mother   . Arthritis Father   . Early death Father   . Heart disease Father   . Heart attack Father   . Hyperlipidemia Father   . Hypertension Father   . Arthritis Sister   . Depression Sister   . Hypertension Sister   . Hyperlipidemia Sister    No Known Allergies Current Outpatient Medications on File Prior to Visit  Medication Sig Dispense Refill  . atorvastatin (LIPITOR) 20 MG tablet Take 20 mg by mouth daily.  0    . levothyroxine (SYNTHROID, LEVOTHROID) 88 MCG tablet Take 88 mcg by mouth daily.    Marland Kitchen losartan (COZAAR) 100 MG tablet Take 100 mg by mouth daily.  0  . LYRICA 150 MG capsule Take 1 capsule (150 mg total) by mouth daily. For neuropathy. (Patient taking differently: Take 150 mg by mouth 2 (two) times daily. For neuropathy.) 90 capsule 0  . meloxicam (MOBIC) 15 MG tablet TK 1 T PO QD WF PRN  1  . Multiple Vitamins-Minerals (PRESERVISION AREDS 2 PO) Take 1 capsule by mouth 2 (two) times daily.     No current facility-administered medications on file prior to visit.      Review of Systems  Constitutional: Positive for fatigue. Negative for appetite change, diaphoresis and fever.  HENT: Positive for congestion, postnasal drip, rhinorrhea and sore throat. Negative for ear pain, sinus pressure, sneezing, trouble swallowing and voice change.   Eyes: Negative for pain and discharge.  Respiratory: Negative for cough, shortness of breath, wheezing and stridor.        Clearing throat    Cardiovascular: Negative for chest pain.  Gastrointestinal: Negative for diarrhea, nausea and vomiting.  Genitourinary: Negative for frequency, hematuria and urgency.  Musculoskeletal: Negative for arthralgias  and myalgias.  Skin: Negative for rash.  Neurological: Positive for headaches. Negative for dizziness, weakness and light-headedness.  Psychiatric/Behavioral: Negative for confusion and dysphoric mood.       Objective:   Physical Exam Constitutional:      General: She is not in acute distress.    Appearance: Normal appearance. She is well-developed. She is not ill-appearing, toxic-appearing or diaphoretic.  HENT:     Head: Normocephalic and atraumatic.     Comments: Nares are injected and congested      Right Ear: Tympanic membrane, ear canal and external ear normal.     Left Ear: Tympanic membrane, ear canal and external ear normal.     Nose: Congestion and rhinorrhea present.     Mouth/Throat:      Mouth: Mucous membranes are moist.     Pharynx: Oropharynx is clear. No oropharyngeal exudate or posterior oropharyngeal erythema.     Comments: Clear pnd - with frequent throat clearing  No erythema or swelling  Eyes:     General: No scleral icterus.       Right eye: No discharge.        Left eye: No discharge.     Conjunctiva/sclera: Conjunctivae normal.     Pupils: Pupils are equal, round, and reactive to light.  Neck:     Musculoskeletal: Normal range of motion and neck supple.  Cardiovascular:     Rate and Rhythm: Normal rate.     Heart sounds: Normal heart sounds.  Pulmonary:     Effort: Pulmonary effort is normal. No respiratory distress.     Breath sounds: Normal breath sounds. No stridor. No wheezing, rhonchi or rales.     Comments: Good air exch No wheeze  Chest:     Chest wall: No tenderness.  Lymphadenopathy:     Cervical: No cervical adenopathy.  Skin:    General: Skin is warm and dry.     Capillary Refill: Capillary refill takes less than 2 seconds.     Findings: No rash.  Neurological:     Mental Status: She is alert.     Cranial Nerves: No cranial nerve deficit.  Psychiatric:        Mood and Affect: Mood normal.           Assessment & Plan:   Problem List Items Addressed This Visit      Other   Sore throat - Primary    With pnd and throat clearing  Reassuring exam  Suspect early viral uri  Discussed sympt care Salt water gargle/analgesic prn  Expectorant with DM otc for cong/cough if needed Continue antihistamine Update if not starting to improve in a week or if worsening

## 2018-06-12 ENCOUNTER — Other Ambulatory Visit: Payer: Medicare Other

## 2018-06-18 ENCOUNTER — Telehealth: Payer: Self-pay | Admitting: Primary Care

## 2018-06-18 NOTE — Telephone Encounter (Signed)
Pt dropped off form to be filled out for parking placard. Placed in [place].

## 2018-06-18 NOTE — Telephone Encounter (Signed)
Completed and placed form in [facility] in box.

## 2018-06-18 NOTE — Telephone Encounter (Signed)
Place in [facility]

## 2018-06-19 ENCOUNTER — Telehealth: Payer: Self-pay | Admitting: Primary Care

## 2018-06-19 DIAGNOSIS — M150 Primary generalized (osteo)arthritis: Principal | ICD-10-CM

## 2018-06-19 DIAGNOSIS — M8949 Other hypertrophic osteoarthropathy, multiple sites: Secondary | ICD-10-CM

## 2018-06-19 DIAGNOSIS — M159 Polyosteoarthritis, unspecified: Secondary | ICD-10-CM

## 2018-06-19 MED ORDER — MELOXICAM 15 MG PO TABS: 15.0000 mg | ORAL_TABLET | Freq: Every day | ORAL | 0 refills | Status: DC | PRN

## 2018-06-19 NOTE — Telephone Encounter (Signed)
Per DPR, left detail message for patient that form is ready for pick up. Left in the front

## 2018-06-19 NOTE — Telephone Encounter (Signed)
Noted.  Refill sent to pharmacy. 

## 2018-06-19 NOTE — Telephone Encounter (Signed)
Pt need refills for 90 day supply    melioxicam 15mg    Sent to [facility]   [phone]

## 2018-06-19 NOTE — Telephone Encounter (Signed)
Have not been prescribed. Last office visit on  01/29/2018 CPE. Next future appointment on 01/27/2019 with Katha Cabal

## 2018-07-01 ENCOUNTER — Ambulatory Visit
Admission: RE | Admit: 2018-07-01 | Discharge: 2018-07-01 | Disposition: A | Discharge status: Home / Self Care | Payer: Medicare Other | Source: Ambulatory Visit | Attending: Primary Care | Admitting: Primary Care

## 2018-07-01 DIAGNOSIS — E2839 Other primary ovarian failure: Secondary | ICD-10-CM | POA: Diagnosis not present

## 2018-07-02 ENCOUNTER — Telehealth: Payer: Self-pay | Admitting: Primary Care

## 2018-07-02 NOTE — Telephone Encounter (Signed)
Patient returned [facility] call.  Please call patient back.

## 2018-07-03 NOTE — Telephone Encounter (Signed)
Pt returning your call. Please call pt. °

## 2018-07-04 NOTE — Telephone Encounter (Signed)
See result note.  

## 2018-07-09 ENCOUNTER — Other Ambulatory Visit: Payer: Self-pay | Admitting: Primary Care

## 2018-07-09 DIAGNOSIS — M810 Age-related osteoporosis without current pathological fracture: Secondary | ICD-10-CM

## 2018-07-09 DIAGNOSIS — G629 Polyneuropathy, unspecified: Secondary | ICD-10-CM

## 2018-07-09 MED ORDER — LYRICA 150 MG PO CAPS: 150.0000 mg | ORAL_CAPSULE | Freq: Two times a day (BID) | ORAL | 0 refills | Status: DC

## 2018-07-09 MED ORDER — ALENDRONATE SODIUM 70 MG PO TABS: ORAL_TABLET | ORAL | 3 refills | Status: DC

## 2018-07-09 NOTE — Assessment & Plan Note (Signed)
Noted on recent bone density scan.  Patient believes she has been treated but has been years ago.  Will initiate treatment with Fosamax weekly.  Discussed instructions for administration.  Repeat bone at the scan 2 years.

## 2018-09-09 ENCOUNTER — Encounter: Payer: Self-pay | Admitting: Primary Care

## 2018-09-09 ENCOUNTER — Ambulatory Visit (INDEPENDENT_AMBULATORY_CARE_PROVIDER_SITE_OTHER): Payer: Medicare Other | Admitting: Primary Care

## 2018-09-09 DIAGNOSIS — K219 Gastro-esophageal reflux disease without esophagitis: Secondary | ICD-10-CM | POA: Insufficient documentation

## 2018-09-09 NOTE — Patient Instructions (Signed)
Start famotidine 20 mg once to twice daily for symptoms of indigestion.   Please send me an update in 1-2 weeks.  It was a pleasure to see you today!

## 2018-09-09 NOTE — Progress Notes (Signed)
Subjective:    Patient ID: Michelle Ball, female    DOB: 1933-04-08, 85 y.o.   MRN: [id number]  HPI  Virtual Visit via Video Note  I connected with Michelle Ball on 09/09/18 at  2:40 PM EDT by a video enabled telemedicine application and verified that I am speaking with the correct person using two identifiers.  Location: Patient: Home Provider: Office   I discussed the limitations of evaluation and management by telemedicine and the availability of in person appointments. The patient expressed understanding and agreed to proceed.  History of Present Illness:  Michelle Ball is an 85 year old female with a history of hypothyroidism, constipation, hypertension, neuropathy who presents today with a chief complaint of constipation.  She had a bout of abdominal "ache" with cramping to her entire upper abdomen "above my weight and under my chest". This has been intermittent for 2-3 weeks and will come within 30 minutes after eating. She eats a lot of salads with nuts, also onions. She denies abrupt changes in her diet except by cutting out fast food, sweets, and breads about 6-8 weeks ago.   She's recently been on psyllium husk and HCG for 6-7 weeks for weight loss. She stopped the HCG and psyllium husk about 2 weeks ago. She's not taken anything OTC for her symptoms. She is having bowel movements daily. She denies esophageal burning, nausea, constipation, diarrhea. She is belching more frequently.    Observations/Objective:  Alert and oriented. Appears well, not sickly. No distress. Speaking in complete sentences.   Assessment and Plan:  Symptoms suggestive of GERD moreso than anything. She has no nausea, localized abdominal pain, fevers. She is having daily bowel movements.  History of cholecystectomy years ago. Will start with Pepcid 20 mg once to twice daily. Also discuss other GERD triggers and to avoid laying flat within 2 hours after eating.  She will start the  Pepcid and update [place] in 1-2 weeks. If no improvement then we will plan to bring her into the office.   Follow Up Instructions:  Start famotidine 20 mg once to twice daily for symptoms of indigestion.   Please send me an update in 1-2 weeks.  It was a pleasure to see you today!    I discussed the assessment and treatment plan with the patient. The patient was provided an opportunity to ask questions and all were answered. The patient agreed with the plan and demonstrated an understanding of the instructions.   The patient was advised to call back or seek an in-person evaluation if the symptoms worsen or if the condition fails to improve as anticipated.     Pleas Koch, NP    Review of Systems  Constitutional: Negative for appetite change and fever.  Respiratory: Negative for shortness of breath.   Cardiovascular: Negative for chest pain.  Gastrointestinal: Negative for blood in stool, constipation and diarrhea.       Belching, upper abdominal discomfort without pain, bloating       Past Medical History:  Diagnosis Date  . Arthritis   . COPD (chronic obstructive pulmonary disease) ([place])   . Environmental and seasonal allergies   . Hyperlipidemia   . Hypertension   . Neuropathy   . Thyroid disease      Social History   Socioeconomic History  . Marital status: Married    Spouse name: Not on file  . Number of children: Not on file  . Years of education: Not on file  . Highest  education level: Not on file  Occupational History  . Not on file  Social Needs  . Financial resource strain: Not on file  . Food insecurity:    Worry: Not on file    Inability: Not on file  . Transportation needs:    Medical: Not on file    Non-medical: Not on file  Tobacco Use  . Smoking status: Former Research scientist (life sciences)  . Smokeless tobacco: Never Used  Substance and Sexual Activity  . Alcohol use: Never    Frequency: Never  . Drug use: Never  . Sexual activity: Not Currently   Lifestyle  . Physical activity:    Days per week: Not on file    Minutes per session: Not on file  . Stress: Not on file  Relationships  . Social connections:    Talks on phone: Not on file    Gets together: Not on file    Attends religious service: Not on file    Active member of club or organization: Not on file    Attends meetings of clubs or organizations: Not on file    Relationship status: Not on file  . Intimate partner violence:    Fear of current or ex partner: Not on file    Emotionally abused: Not on file    Physically abused: Not on file    Forced sexual activity: Not on file  Other Topics Concern  . Not on file  Social History Narrative  . Not on file    Past Surgical History:  Procedure Laterality Date  . ABDOMINAL HYSTERECTOMY    . APPENDECTOMY    . CHOLECYSTECTOMY    . REPLACEMENT TOTAL KNEE Right 2016  . TONSILLECTOMY AND ADENOIDECTOMY      Family History  Problem Relation Age of Onset  . Arthritis Mother   . Cancer Mother   . Arthritis Father   . Early death Father   . Heart disease Father   . Heart attack Father   . Hyperlipidemia Father   . Hypertension Father   . Arthritis Sister   . Depression Sister   . Hypertension Sister   . Hyperlipidemia Sister     No Known Allergies  Current Outpatient Medications on File Prior to Visit  Medication Sig Dispense Refill  . alendronate (FOSAMAX) 70 MG tablet Take 1 tablet by mouth once weekly with a full glass of water on an empty stomach.  Avoid laying flat for 1 hour. 12 tablet 3  . atorvastatin (LIPITOR) 20 MG tablet Take 20 mg by mouth daily.  0  . levothyroxine (SYNTHROID, LEVOTHROID) 88 MCG tablet Take 88 mcg by mouth daily.    Marland Kitchen losartan (COZAAR) 100 MG tablet Take 100 mg by mouth daily.  0  . LYRICA 150 MG capsule Take 1 capsule (150 mg total) by mouth 2 (two) times daily. For neuropathy. 180 capsule 0  . meloxicam (MOBIC) 15 MG tablet Take 1 tablet (15 mg total) by mouth daily as needed for  pain. 90 tablet 0  . Multiple Vitamins-Minerals (PRESERVISION AREDS 2 PO) Take 1 capsule by mouth 2 (two) times daily.     No current facility-administered medications on file prior to visit.     Pulse 77   Wt 158 lb (71.7 kg)   BMI 27.12 kg/m    Objective:   Physical Exam  Constitutional: She is oriented to person, place, and time. She appears well-nourished.  Neck: Neck supple.  Respiratory: Effort normal.  Neurological: She is alert and  oriented to person, place, and time.  Psychiatric: She has a normal mood and affect.           Assessment & Plan:

## 2018-09-09 NOTE — Assessment & Plan Note (Addendum)
  Symptoms suggestive of GERD moreso than anything. She has no nausea, localized abdominal pain, fevers. She is having daily bowel movements.  History of cholecystectomy years ago. Will start with Pepcid 20 mg once to twice daily. Also discuss other GERD triggers and to avoid laying flat within 2 hours after eating.  She will start the Pepcid and update [place] in 1-2 weeks. If no improvement then we will plan to bring her into the office.

## 2018-09-13 ENCOUNTER — Other Ambulatory Visit: Payer: Self-pay | Admitting: Primary Care

## 2018-09-13 DIAGNOSIS — M8949 Other hypertrophic osteoarthropathy, multiple sites: Secondary | ICD-10-CM

## 2018-09-13 DIAGNOSIS — M159 Polyosteoarthritis, unspecified: Secondary | ICD-10-CM

## 2018-09-13 DIAGNOSIS — G629 Polyneuropathy, unspecified: Secondary | ICD-10-CM

## 2018-09-13 NOTE — Telephone Encounter (Signed)
Pt lef v/m that pharmacy was sending refill request for lyrica 150 mg and pt wants to make sure that instructions were updated to take lyrica one cap bid.

## 2018-09-13 NOTE — Telephone Encounter (Signed)
LYRICA 150 mg Last prescribed on 08/28/2017.   meloxicam (MOBIC) 15 MG Last prescribed on 06/19/2018  Last office visit on 09/09/2018. No future appointment

## 2018-09-13 NOTE — Telephone Encounter (Signed)
Noted, refills sent to pharmacy. 

## 2018-09-25 ENCOUNTER — Other Ambulatory Visit: Payer: Self-pay | Admitting: *Deleted

## 2018-09-25 DIAGNOSIS — E785 Hyperlipidemia, unspecified: Secondary | ICD-10-CM

## 2018-09-25 DIAGNOSIS — I10 Essential (primary) hypertension: Secondary | ICD-10-CM

## 2018-09-25 DIAGNOSIS — E039 Hypothyroidism, unspecified: Secondary | ICD-10-CM

## 2018-09-25 NOTE — Telephone Encounter (Signed)
Rx have not been by prescribed Allie Bossier. Last office visit on 09/09/2018. Next future appointment on 01/27/2019 AWV

## 2018-09-25 NOTE — Telephone Encounter (Signed)
Patient left a voicemail stating that she has requested refills from her pharmacy for Levothyroxine, Losartan and Atorvastatin. Patient stated that she is completely out of the medications.

## 2018-09-26 MED ORDER — LEVOTHYROXINE SODIUM 88 MCG PO TABS: ORAL_TABLET | ORAL | 0 refills | Status: DC

## 2018-09-26 MED ORDER — LOSARTAN POTASSIUM 100 MG PO TABS: 100.0000 mg | ORAL_TABLET | Freq: Every day | ORAL | 0 refills | Status: DC

## 2018-09-26 MED ORDER — ATORVASTATIN CALCIUM 20 MG PO TABS: 20.0000 mg | ORAL_TABLET | Freq: Every day | ORAL | 0 refills | Status: DC

## 2018-09-26 NOTE — Telephone Encounter (Signed)
Noted refills sent to pharmacy.

## 2018-11-25 ENCOUNTER — Other Ambulatory Visit: Payer: Self-pay | Admitting: Primary Care

## 2018-11-25 ENCOUNTER — Telehealth: Payer: Self-pay | Admitting: Primary Care

## 2018-11-25 DIAGNOSIS — Z1231 Encounter for screening mammogram for malignant neoplasm of breast: Secondary | ICD-10-CM

## 2018-11-25 NOTE — Telephone Encounter (Signed)
Best number [phone]  Pt has cpx 10/5.  She stated she was diagnosed with COPD several years ago.  She wanted to know if there was a test that she could have to see how this has progressed.  She stated she is not having any symptoms except clearing her throat first thing in am  .

## 2018-11-25 NOTE — Telephone Encounter (Signed)
We can't do spirometry in the office due to Covid, this is the best way to test for her COPD. COPD is chronic, but can improve over time once that patient stops smoking. Is she still smoking? If so then it's safe to say that her COPD is likely progressing. If she stopped smoking then she's like had improvement, especially with the lack of daily cough with chest congestion and shortness of breath. Throat clearing in the morning seems like a mild symptom.

## 2018-11-26 NOTE — Telephone Encounter (Signed)
Per DPR, left detail message of Kate Clark's comments for patient. 

## 2018-12-20 ENCOUNTER — Other Ambulatory Visit: Payer: Self-pay | Admitting: Primary Care

## 2018-12-20 DIAGNOSIS — E785 Hyperlipidemia, unspecified: Secondary | ICD-10-CM

## 2018-12-20 DIAGNOSIS — G629 Polyneuropathy, unspecified: Secondary | ICD-10-CM

## 2018-12-20 DIAGNOSIS — E039 Hypothyroidism, unspecified: Secondary | ICD-10-CM

## 2018-12-20 DIAGNOSIS — I10 Essential (primary) hypertension: Secondary | ICD-10-CM

## 2018-12-20 DIAGNOSIS — M8949 Other hypertrophic osteoarthropathy, multiple sites: Secondary | ICD-10-CM

## 2018-12-20 DIAGNOSIS — M159 Polyosteoarthritis, unspecified: Secondary | ICD-10-CM

## 2018-12-20 NOTE — Telephone Encounter (Signed)
Noted.  Refill sent to pharmacy. 

## 2018-12-20 NOTE — Telephone Encounter (Signed)
Last prescribed on 09/13/2018 . Last appointment on 09/09/2018. Next future appointment on 02/03/2019

## 2018-12-30 ENCOUNTER — Ambulatory Visit
Admission: RE | Admit: 2018-12-30 | Discharge: 2018-12-30 | Disposition: A | Discharge status: Home / Self Care | Payer: Medicare Other | Source: Ambulatory Visit | Attending: Primary Care | Admitting: Primary Care

## 2018-12-30 DIAGNOSIS — Z1231 Encounter for screening mammogram for malignant neoplasm of breast: Secondary | ICD-10-CM | POA: Diagnosis not present

## 2019-01-20 ENCOUNTER — Telehealth: Payer: Self-pay | Admitting: Primary Care

## 2019-01-20 DIAGNOSIS — E039 Hypothyroidism, unspecified: Secondary | ICD-10-CM

## 2019-01-20 DIAGNOSIS — G629 Polyneuropathy, unspecified: Secondary | ICD-10-CM

## 2019-01-20 DIAGNOSIS — I10 Essential (primary) hypertension: Secondary | ICD-10-CM

## 2019-01-20 DIAGNOSIS — E785 Hyperlipidemia, unspecified: Secondary | ICD-10-CM

## 2019-01-20 NOTE — Telephone Encounter (Signed)
Noted. There aren't specific labs related to neuropathy but I will make sure to include labs that could contribute to numbness/tingling.

## 2019-01-20 NOTE — Telephone Encounter (Signed)
Called pt to make changes to appointment for medicare wellness. She is having labs next Monday 02/26/19 and she is wondering do draw labs related to neuropathy/ I tried to explain that Anda Kraft would order labs as she seems necessary for the visit but she wanted me to put in a note regarding this.  CB [phone]

## 2019-01-21 NOTE — Telephone Encounter (Signed)
Spoken and notified patient of Kate Clark's comments. Patient verbalized understanding.  

## 2019-01-27 ENCOUNTER — Ambulatory Visit: Payer: Medicare Other

## 2019-01-27 ENCOUNTER — Other Ambulatory Visit (INDEPENDENT_AMBULATORY_CARE_PROVIDER_SITE_OTHER): Payer: Medicare Other

## 2019-01-27 ENCOUNTER — Ambulatory Visit (INDEPENDENT_AMBULATORY_CARE_PROVIDER_SITE_OTHER): Payer: Medicare Other

## 2019-01-27 VITALS — Wt 155.0 lb

## 2019-01-27 DIAGNOSIS — E039 Hypothyroidism, unspecified: Secondary | ICD-10-CM | POA: Diagnosis not present

## 2019-01-27 DIAGNOSIS — E785 Hyperlipidemia, unspecified: Secondary | ICD-10-CM | POA: Diagnosis not present

## 2019-01-27 DIAGNOSIS — G629 Polyneuropathy, unspecified: Secondary | ICD-10-CM

## 2019-01-27 DIAGNOSIS — Z0000 Encounter for general adult medical examination without abnormal findings: Secondary | ICD-10-CM | POA: Diagnosis not present

## 2019-01-27 DIAGNOSIS — I10 Essential (primary) hypertension: Secondary | ICD-10-CM

## 2019-01-27 LAB — LIPID PANEL
Cholesterol: 116 mg/dL (ref 0–200)
HDL: 47.9 mg/dL (ref >39.00)
LDL Cholesterol: 45 mg/dL (ref 0–99)
NonHDL: 68.04
Total CHOL/HDL Ratio: 2
Triglycerides: 113 mg/dL (ref 0.0–149.0)
VLDL: 22.6 mg/dL (ref 0.0–40.0)

## 2019-01-27 LAB — COMPREHENSIVE METABOLIC PANEL
ALT: 18 U/L (ref 0–35)
AST: 20 U/L (ref 0–37)
Albumin: 4.3 g/dL (ref 3.5–5.2)
Alkaline Phosphatase: 51 U/L (ref 39–117)
BUN: 16 mg/dL (ref 6–23)
CO2: 31 mEq/L (ref 19–32)
Calcium: 9.3 mg/dL (ref 8.4–10.5)
Chloride: 102 mEq/L (ref 96–112)
Creatinine, Ser: 0.68 mg/dL (ref 0.40–1.20)
GFR: 82.31 mL/min (ref >60.00)
Glucose, Bld: 87 mg/dL (ref 70–99)
Potassium: 4.2 mEq/L (ref 3.5–5.1)
Sodium: 139 mEq/L (ref 135–145)
Total Bilirubin: 0.6 mg/dL (ref 0.2–1.2)
Total Protein: 6.7 g/dL (ref 6.0–8.3)

## 2019-01-27 LAB — CBC
HCT: 39.8 % (ref 36.0–46.0)
Hemoglobin: 13.1 g/dL (ref 12.0–15.0)
MCHC: 33 g/dL (ref 30.0–36.0)
MCV: 92.6 fl (ref 78.0–100.0)
Platelets: 208 10*3/uL (ref 150.0–400.0)
RBC: 4.3 Mil/uL (ref 3.87–5.11)
RDW: 13.4 % (ref 11.5–15.5)
WBC: 6.2 10*3/uL (ref 4.0–10.5)

## 2019-01-27 LAB — VITAMIN B12: Vitamin B-12: 597 pg/mL (ref 211–911)

## 2019-01-27 LAB — TSH: TSH: 1.48 u[IU]/mL (ref 0.35–4.50)

## 2019-01-27 NOTE — Patient Instructions (Signed)
Michelle Ball , Thank you for taking time to come for your Medicare Wellness Visit. I appreciate your ongoing commitment to your health goals. Please review the following plan we discussed and let me know if I can assist you in the future.   Screening recommendations/referrals: Colonoscopy: no longer required Mammogram: up to date, completed 12/30/2018 Bone Density: up to date, completed 07/01/2018 Recommended yearly ophthalmology/optometry visit for glaucoma screening and checkup Recommended yearly dental visit for hygiene and checkup  Vaccinations: Influenza vaccine: up to date, completed 2 weeks ago per patient Pneumococcal vaccine: series completed Tdap vaccine: up to date, completed 01/10/2012 Shingles vaccine: discussed    Advanced directives: Please bring a copy of your POA (Power of [place]) and/or Living Will to your next appointment.   Conditions/risks identified: hypertension, hyperlipidemia  Next appointment: 02/03/2019 @ 8:20 am    Preventive Care 65 Years and Older, Female Preventive care refers to lifestyle choices and visits with your health care provider that can promote health and wellness. What does preventive care include?  A yearly physical exam. This is also called an annual well check.  Dental exams once or twice a year.  Routine eye exams. Ask your health care provider how often you should have your eyes checked.  Personal lifestyle choices, including:  Daily care of your teeth and gums.  Regular physical activity.  Eating a healthy diet.  Avoiding tobacco and drug use.  Limiting alcohol use.  Practicing safe sex.  Taking low-dose aspirin every day.  Taking vitamin and mineral supplements as recommended by your health care provider. What happens during an annual well check? The services and screenings done by your health care provider during your annual well check will depend on your age, overall health, lifestyle risk factors, and family  history of disease. Counseling  Your health care provider may ask you questions about your:  Alcohol use.  Tobacco use.  Drug use.  Emotional well-being.  Home and relationship well-being.  Sexual activity.  Eating habits.  History of falls.  Memory and ability to understand (cognition).  Work and work Statistician.  Reproductive health. Screening  You may have the following tests or measurements:  Height, weight, and BMI.  Blood pressure.  Lipid and cholesterol levels. These may be checked every 5 years, or more frequently if you are over 53 years old.  Skin check.  Lung cancer screening. You may have this screening every year starting at age 83 if you have a 30-pack-year history of smoking and currently smoke or have quit within the past 15 years.  Fecal occult blood test (FOBT) of the stool. You may have this test every year starting at age 83.  Flexible sigmoidoscopy or colonoscopy. You may have a sigmoidoscopy every 5 years or a colonoscopy every 10 years starting at age 83.  Hepatitis C blood test.  Hepatitis B blood test.  Sexually transmitted disease (STD) testing.  Diabetes screening. This is done by checking your blood sugar (glucose) after you have not eaten for a while (fasting). You may have this done every 1-3 years.  Bone density scan. This is done to screen for osteoporosis. You may have this done starting at age 83.  Mammogram. This may be done every 1-2 years. Talk to your health care provider about how often you should have regular mammograms. Talk with your health care provider about your test results, treatment options, and if necessary, the need for more tests. Vaccines  Your health care provider may recommend certain vaccines,  such as:  Influenza vaccine. This is recommended every year.  Tetanus, diphtheria, and acellular pertussis (Tdap, Td) vaccine. You may need a Td booster every 10 years.  Zoster vaccine. You may need this after  age 84.  Pneumococcal 13-valent conjugate (PCV13) vaccine. One dose is recommended after age 83.  Pneumococcal polysaccharide (PPSV23) vaccine. One dose is recommended after age 83. Talk to your health care provider about which screenings and vaccines you need and how often you need them. This information is not intended to replace advice given to you by your health care provider. Make sure you discuss any questions you have with your health care provider. Document Released: 05/14/2015 Document Revised: 01/05/2016 Document Reviewed: 02/16/2015 Elsevier Interactive Patient Education  2017 [place] Prevention in the Home Falls can cause injuries. They can happen to people of all ages. There are many things you can do to make your home safe and to help prevent falls. What can I do on the outside of my home?  Regularly fix the edges of walkways and driveways and fix any cracks.  Remove anything that might make you trip as you walk through a door, such as a raised step or threshold.  Trim any bushes or trees on the path to your home.  Use bright outdoor lighting.  Clear any walking paths of anything that might make someone trip, such as rocks or tools.  Regularly check to see if handrails are loose or broken. Make sure that both sides of any steps have handrails.  Any raised decks and porches should have guardrails on the edges.  Have any leaves, snow, or ice cleared regularly.  Use sand or salt on walking paths during winter.  Clean up any spills in your garage right away. This includes oil or grease spills. What can I do in the bathroom?  Use night lights.  Install grab bars by the toilet and in the tub and shower. Do not use towel bars as grab bars.  Use non-skid mats or decals in the tub or shower.  If you need to sit down in the shower, use a plastic, non-slip stool.  Keep the floor dry. Clean up any water that spills on the floor as soon as it happens.   Remove soap buildup in the tub or shower regularly.  Attach bath mats securely with double-sided non-slip rug tape.  Do not have throw rugs and other things on the floor that can make you trip. What can I do in the bedroom?  Use night lights.  Make sure that you have a light by your bed that is easy to reach.  Do not use any sheets or blankets that are too big for your bed. They should not hang down onto the floor.  Have a firm chair that has side arms. You can use this for support while you get dressed.  Do not have throw rugs and other things on the floor that can make you trip. What can I do in the kitchen?  Clean up any spills right away.  Avoid walking on wet floors.  Keep items that you use a lot in easy-to-reach places.  If you need to reach something above you, use a strong step stool that has a grab bar.  Keep electrical cords out of the way.  Do not use floor polish or wax that makes floors slippery. If you must use wax, use non-skid floor wax.  Do not have throw rugs and other things on  the floor that can make you trip. What can I do with my stairs?  Do not leave any items on the stairs.  Make sure that there are handrails on both sides of the stairs and use them. Fix handrails that are broken or loose. Make sure that handrails are as long as the stairways.  Check any carpeting to make sure that it is firmly attached to the stairs. Fix any carpet that is loose or worn.  Avoid having throw rugs at the top or bottom of the stairs. If you do have throw rugs, attach them to the floor with carpet tape.  Make sure that you have a light switch at the top of the stairs and the bottom of the stairs. If you do not have them, ask someone to add them for you. What else can I do to help prevent falls?  Wear shoes that:  Do not have high heels.  Have rubber bottoms.  Are comfortable and fit you well.  Are closed at the toe. Do not wear sandals.  If you use a  stepladder:  Make sure that it is fully opened. Do not climb a closed stepladder.  Make sure that both sides of the stepladder are locked into place.  Ask someone to hold it for you, if possible.  Clearly mark and make sure that you can see:  Any grab bars or handrails.  First and last steps.  Where the edge of each step is.  Use tools that help you move around (mobility aids) if they are needed. These include:  Canes.  Walkers.  Scooters.  Crutches.  Turn on the lights when you go into a dark area. Replace any light bulbs as soon as they burn out.  Set up your furniture so you have a clear path. Avoid moving your furniture around.  If any of your floors are uneven, fix them.  If there are any pets around you, be aware of where they are.  Review your medicines with your doctor. Some medicines can make you feel dizzy. This can increase your chance of falling. Ask your doctor what other things that you can do to help prevent falls. This information is not intended to replace advice given to you by your health care provider. Make sure you discuss any questions you have with your health care provider. Document Released: 02/11/2009 Document Revised: 09/23/2015 Document Reviewed: 05/22/2014 Elsevier Interactive Patient Education  2017 [employer].

## 2019-01-27 NOTE — Progress Notes (Signed)
Subjective:   Michelle Ball is a 85 y.o. female who presents for Medicare Annual (Subsequent) preventive examination.  Review of Systems:    This visit is being conducted through telemedicine via telephone at the nurse health advisor's home address due to the COVID-19 pandemic. This patient has given me verbal consent via doximity to conduct this visit, patient states they are participating from their home address. Some vital signs may be absent or patient reported.    Patient identification: identified by name, DOB, and current address  Cardiac Risk Factors include: advanced age (>68men, >75 women);hypertension;dyslipidemia;sedentary lifestyle     Objective:     Vitals: Wt 155 lb (70.3 kg)   BMI 26.61 kg/m   Body mass index is 26.61 kg/m.  Advanced Directives 01/27/2019 01/22/2018  Does Patient Have a Medical Advance Directive? No;Yes Yes  Type of Paramedic of [place];Living will [place];Living will  Copy of [place] in Chart? No - copy requested No - copy requested    Tobacco Social History   Tobacco Use  Smoking Status Former Smoker  Smokeless Tobacco Never Used     Counseling given: Not Answered   Clinical Intake:  Pre-visit preparation completed: Yes  Pain : No/denies pain     Nutritional Risks: None Diabetes: No  How often do you need to have someone help you when you read instructions, pamphlets, or other written materials from your doctor or pharmacy?: 1 - Never What is the last grade level you completed in school?: Masters degree  Interpreter Needed?: No  Information entered by :: CJohnson, LPN  Past Medical History:  Diagnosis Date  . Arthritis   . COPD (chronic obstructive pulmonary disease) ([place])   . Environmental and seasonal allergies   . Hyperlipidemia   . Hypertension   . Neuropathy   . Thyroid disease    Past Surgical History:  Procedure Laterality Date  .  ABDOMINAL HYSTERECTOMY    . APPENDECTOMY    . CHOLECYSTECTOMY    . REPLACEMENT TOTAL KNEE Right 2016  . TONSILLECTOMY AND ADENOIDECTOMY     Family History  Problem Relation Age of Onset  . Arthritis Mother   . Cancer Mother   . Arthritis Father   . Early death Father   . Heart disease Father   . Heart attack Father   . Hyperlipidemia Father   . Hypertension Father   . Arthritis Sister   . Depression Sister   . Hypertension Sister   . Hyperlipidemia Sister    Social History   Socioeconomic History  . Marital status: Married    Spouse name: Not on file  . Number of children: Not on file  . Years of education: Not on file  . Highest education level: Not on file  Occupational History  . Not on file  Social Needs  . Financial resource strain: Not hard at all  . Food insecurity    Worry: Never true    Inability: Never true  . Transportation needs    Medical: No    Non-medical: No  Tobacco Use  . Smoking status: Former Research scientist (life sciences)  . Smokeless tobacco: Never Used  Substance and Sexual Activity  . Alcohol use: Never    Frequency: Never  . Drug use: Never  . Sexual activity: Not Currently  Lifestyle  . Physical activity    Days per week: 0 days    Minutes per session: 0 min  . Stress: Not at all  Relationships  . Social Herbalist on phone: Not on file    Gets together: Not on file    Attends religious service: Not on file    Active member of club or organization: Not on file    Attends meetings of clubs or organizations: Not on file    Relationship status: Not on file  Other Topics Concern  . Not on file  Social History Narrative  . Not on file    Outpatient Encounter Medications as of 01/27/2019  Medication Sig  . alendronate (FOSAMAX) 70 MG tablet Take 1 tablet by mouth once weekly with a full glass of water on an empty stomach.  Avoid laying flat for 1 hour.  Marland Kitchen atorvastatin (LIPITOR) 20 MG tablet TAKE 1 TABLET(20 MG) BY MOUTH DAILY FOR CHOLESTEROL   . levothyroxine (SYNTHROID) 88 MCG tablet TAKE 1 TABLET BY MOUTH EVERY MORNING ON AN EMPTY STOMACH, NO FOOD OR OTHER MEDICATIONS FOR 30 MINUTES  . losartan (COZAAR) 100 MG tablet TAKE 1 TABLET(100 MG) BY MOUTH DAILY FOR BLOOD PRESSURE  . LYRICA 150 MG capsule TAKE 1 CAPSULE(150 MG) BY MOUTH TWICE DAILY FOR NEUROPATHY  . meloxicam (MOBIC) 15 MG tablet TAKE 1 TABLET(15 MG) BY MOUTH DAILY AS NEEDED FOR PAIN  . Multiple Vitamins-Minerals (PRESERVISION AREDS 2 PO) Take 1 capsule by mouth 2 (two) times daily.  Marland Kitchen VYZULTA 0.024 % SOLN INT 1 GTT IN OU QHS   No facility-administered encounter medications on file as of 01/27/2019.     Activities of Daily Living In your present state of health, do you have any difficulty performing the following activities: 01/27/2019  Hearing? N  Vision? N  Difficulty concentrating or making decisions? N  Walking or climbing stairs? Y  Comment Patient has joint pain and neuropathy  Dressing or bathing? N  Doing errands, shopping? N  Preparing Food and eating ? N  Using the Toilet? N  In the past six months, have you accidently leaked urine? Y  Comment wears pantyliner  Do you have problems with loss of bowel control? N  Managing your Medications? N  Managing your Finances? N  Housekeeping or managing your Housekeeping? N  Some recent data might be hidden    Patient Care Team: Pleas Koch, NP as PCP - General (Internal Medicine)    Assessment:   This is a routine wellness examination for Michelle Ball.  Exercise Activities and Dietary recommendations Current Exercise Habits: The patient does not participate in regular exercise at present, Exercise limited by: orthopedic condition(s)  Goals    . Patient Stated     Starting 01/22/2018, I will continue to take medications as prescribed.     . Patient Stated     01/27/2019, Patient wants to increase her exercise in the future       Fall Risk Fall Risk  01/27/2019 01/22/2018 11/22/2017  Falls in the past  year? 0 No No  Number falls in past yr: 0 - -  Risk for fall due to : Medication side effect;Impaired mobility - -  Follow up Falls evaluation completed;Falls prevention discussed - -   Is the patient's home free of loose throw rugs in walkways, pet beds, electrical cords, etc?   yes      Grab bars in the bathroom? yes      Handrails on the stairs?   yes      Adequate lighting?   yes  Timed Get Up and Go performed: n/a  Depression Screen  PHQ 2/9 Scores 01/27/2019 01/22/2018  PHQ - 2 Score 0 0  PHQ- 9 Score 0 0     Cognitive Function MMSE - Mini Mental State Exam 01/27/2019 01/22/2018  Orientation to time 5 5  Orientation to Place 5 5  Registration 3 3  Attention/ Calculation 5 0  Recall 3 3  Language- name 2 objects - 0  Language- repeat 1 1  Language- follow 3 step command - 3  Language- read & follow direction - 0  Write a sentence - 0  Copy design - 0  Total score - 20  Mini Cog  Mini-Cog screen was completed. Maximum score is 22. A value of 0 denotes this part of the MMSE was not completed or the patient failed this part of the Mini-Cog screening.      Immunization History  Administered Date(s) Administered  . Influenza,inj,Quad PF,6+ Mos 01/22/2018  . Influenza-Unspecified 01/24/2017  . Pneumococcal Conjugate-13 07/29/2013  . Pneumococcal Polysaccharide-23 03/31/2015  . Pneumococcal-Unspecified 05/01/2013  . Td 01/10/2012  . Zoster 05/03/2009    Qualifies for Shingles Vaccine? yes  Screening Tests Health Maintenance  Topic Date Due  . TETANUS/TDAP  01/09/2022  . INFLUENZA VACCINE  Completed  . DEXA SCAN  Completed  . PNA vac Low Risk Adult  Completed    Cancer Screenings: Lung: Low Dose CT Chest recommended if Age 95-80 years, 30 pack-year currently smoking OR have quit w/in 15years. Patient does not qualify. Breast:  Up to date on Mammogram? Yes, completed 12/30/2018   Up to date of Bone Density/Dexa? Yes, completed 07/01/2018 Colorectal: no longer  required  Additional Screenings: : Hepatitis C Screening: n/a     Plan:   Patient wants to increase exercise daily.    I have personally reviewed and noted the following in the patient's chart:   . Medical and social history . Use of alcohol, tobacco or illicit drugs  . Current medications and supplements . Functional ability and status . Nutritional status . Physical activity . Advanced directives . List of other physicians . Hospitalizations, surgeries, and ER visits in previous 12 months . Vitals . Screenings to include cognitive, depression, and falls . Referrals and appointments  In addition, I have reviewed and discussed with patient certain preventive protocols, quality metrics, and best practice recommendations. A written personalized care plan for preventive services as well as general preventive health recommendations were provided to patient.     Andrez Grime, LPN  [phone]

## 2019-01-27 NOTE — Progress Notes (Signed)
PCP notes: none  Health Maintenance: Patient will check with insurance about coverage for Shingrix.    Abnormal Screenings: none    Patient concerns: none    Nurse concerns: none    Next PCP appt.: 02/03/2019 @ 8:20 am

## 2019-02-03 ENCOUNTER — Other Ambulatory Visit: Payer: Self-pay

## 2019-02-03 ENCOUNTER — Ambulatory Visit (INDEPENDENT_AMBULATORY_CARE_PROVIDER_SITE_OTHER): Payer: Medicare Other | Admitting: Primary Care

## 2019-02-03 VITALS — BP 126/82 | HR 67 | Temp 97.8°F | Ht 64.0 in | Wt 156.0 lb

## 2019-02-03 DIAGNOSIS — E785 Hyperlipidemia, unspecified: Secondary | ICD-10-CM

## 2019-02-03 DIAGNOSIS — K219 Gastro-esophageal reflux disease without esophagitis: Secondary | ICD-10-CM

## 2019-02-03 DIAGNOSIS — J449 Chronic obstructive pulmonary disease, unspecified: Secondary | ICD-10-CM

## 2019-02-03 DIAGNOSIS — Z0000 Encounter for general adult medical examination without abnormal findings: Secondary | ICD-10-CM | POA: Diagnosis not present

## 2019-02-03 DIAGNOSIS — M159 Polyosteoarthritis, unspecified: Secondary | ICD-10-CM

## 2019-02-03 DIAGNOSIS — E039 Hypothyroidism, unspecified: Secondary | ICD-10-CM | POA: Diagnosis not present

## 2019-02-03 DIAGNOSIS — I10 Essential (primary) hypertension: Secondary | ICD-10-CM

## 2019-02-03 DIAGNOSIS — M8949 Other hypertrophic osteoarthropathy, multiple sites: Secondary | ICD-10-CM

## 2019-02-03 DIAGNOSIS — G629 Polyneuropathy, unspecified: Secondary | ICD-10-CM

## 2019-02-03 DIAGNOSIS — M810 Age-related osteoporosis without current pathological fracture: Secondary | ICD-10-CM

## 2019-02-03 NOTE — Assessment & Plan Note (Signed)
Recent lipid panel stable. Continue Lipitor.

## 2019-02-03 NOTE — Assessment & Plan Note (Addendum)
Noted on bone density from March 2020. Compliant to Fosamax, continue same. Add in calcium and vitamin D. Encouraged weight bearing exercise. Repeat bone density in 2022.

## 2019-02-03 NOTE — Assessment & Plan Note (Signed)
Intermittent, chronic. Taking Meloxicam HS and doing well.  Continue same.

## 2019-02-03 NOTE — Assessment & Plan Note (Signed)
Denies concerns for reflux. Taking OTC treatment infrequently.

## 2019-02-03 NOTE — Assessment & Plan Note (Signed)
Immunizations UTD. Bone density scan UTD. No need for colon cancer screening due to age. Encouraged a healthy diet and regular exercise. Exam today unremarkable. Labs reviewed.  Follow up in 1 year for CPE.

## 2019-02-03 NOTE — Assessment & Plan Note (Signed)
Stable in the office today, continue losartan. BMP reviewed.

## 2019-02-03 NOTE — Assessment & Plan Note (Signed)
Doing well on Lyrica BID for which she's taken for years. Continue same.

## 2019-02-03 NOTE — Assessment & Plan Note (Signed)
Diagnosed years ago. Overall feels as though she does well without treatment.  She will update.  Exam today benign.

## 2019-02-03 NOTE — Progress Notes (Signed)
Subjective:    Patient ID: Michelle Ball, female    DOB: 01/11/1933, 85 y.o.   MRN: [id number]  HPI  Michelle Ball 85 year old female who presents today for complete physical.  Immunizations: -Tetanus: Completed in 2013 -Influenza: Completed in 2020 -Pneumonia: Completed last in 2016 -Shingles: Completed in 2011  Diet: She endorses a fair diet. She is mostly eating protein drinks, salad, protein, vegetables. Desserts several days weekly. Drinking water, coffee.  Exercise: She is not exercising, just started swimming again.  Eye exam: Completed in 2020 Dental exam: Completed in 2020 Colonoscopy: Declines given age 89: Completed in August 2020 Dexa: Completed in March 2020, osteoporosis. Compliant to Fosamax. Not taking calcium and vitamin.   BP Readings from Last 3 Encounters:  02/03/19 126/82  06/06/18 (!) 142/70  05/22/18 130/70     Review of Systems  Constitutional: Negative for unexpected weight change.  HENT: Negative for rhinorrhea.   Respiratory: Negative for cough and shortness of breath.   Cardiovascular: Negative for chest pain.  Gastrointestinal: Negative for constipation and diarrhea.  Genitourinary: Negative for difficulty urinating.  Musculoskeletal: Positive for arthralgias.  Skin: Negative for rash.  Allergic/Immunologic: Negative for environmental allergies.  Neurological: Negative for dizziness and headaches.       Chronic neuropathy  Psychiatric/Behavioral: The patient is not nervous/anxious.        Past Medical History:  Diagnosis Date  . Arthritis   . COPD (chronic obstructive pulmonary disease) ([place])   . Environmental and seasonal allergies   . Hyperlipidemia   . Hypertension   . Neuropathy   . Thyroid disease      Social History   Socioeconomic History  . Marital status: Married    Spouse name: Not on file  . Number of children: Not on file  . Years of education: Not on file  . Highest education level: Not on file   Occupational History  . Not on file  Social Needs  . Financial resource strain: Not hard at all  . Food insecurity    Worry: Never true    Inability: Never true  . Transportation needs    Medical: No    Non-medical: No  Tobacco Use  . Smoking status: Former Research scientist (life sciences)  . Smokeless tobacco: Never Used  Substance and Sexual Activity  . Alcohol use: Never    Frequency: Never  . Drug use: Never  . Sexual activity: Not Currently  Lifestyle  . Physical activity    Days per week: 0 days    Minutes per session: 0 min  . Stress: Not at all  Relationships  . Social Herbalist on phone: Not on file    Gets together: Not on file    Attends religious service: Not on file    Active member of club or organization: Not on file    Attends meetings of clubs or organizations: Not on file    Relationship status: Not on file  . Intimate partner violence    Fear of current or ex partner: No    Emotionally abused: No    Physically abused: No    Forced sexual activity: No  Other Topics Concern  . Not on file  Social History Narrative  . Not on file    Past Surgical History:  Procedure Laterality Date  . ABDOMINAL HYSTERECTOMY    . APPENDECTOMY    . CHOLECYSTECTOMY    . REPLACEMENT TOTAL KNEE Right 2016  . TONSILLECTOMY AND ADENOIDECTOMY  Family History  Problem Relation Age of Onset  . Arthritis Mother   . Cancer Mother   . Arthritis Father   . Early death Father   . Heart disease Father   . Heart attack Father   . Hyperlipidemia Father   . Hypertension Father   . Arthritis Sister   . Depression Sister   . Hypertension Sister   . Hyperlipidemia Sister     No Known Allergies  Current Outpatient Medications on File Prior to Visit  Medication Sig Dispense Refill  . alendronate (FOSAMAX) 70 MG tablet Take 1 tablet by mouth once weekly with a full glass of water on an empty stomach.  Avoid laying flat for 1 hour. 12 tablet 3  . atorvastatin (LIPITOR) 20 MG tablet  TAKE 1 TABLET(20 MG) BY MOUTH DAILY FOR CHOLESTEROL 90 tablet 0  . levothyroxine (SYNTHROID) 88 MCG tablet TAKE 1 TABLET BY MOUTH EVERY MORNING ON AN EMPTY STOMACH, NO FOOD OR OTHER MEDICATIONS FOR 30 MINUTES 90 tablet 0  . losartan (COZAAR) 100 MG tablet TAKE 1 TABLET(100 MG) BY MOUTH DAILY FOR BLOOD PRESSURE 90 tablet 0  . LYRICA 150 MG capsule TAKE 1 CAPSULE(150 MG) BY MOUTH TWICE DAILY FOR NEUROPATHY 180 capsule 0  . meloxicam (MOBIC) 15 MG tablet TAKE 1 TABLET(15 MG) BY MOUTH DAILY AS NEEDED FOR PAIN 90 tablet 0  . Multiple Vitamins-Minerals (PRESERVISION AREDS 2 PO) Take 1 capsule by mouth 2 (two) times daily.    Marland Kitchen VYZULTA 0.024 % SOLN INT 1 GTT IN OU QHS     No current facility-administered medications on file prior to visit.     BP 126/82   Pulse 67   Temp 97.8 F (36.6 C) (Temporal)   Ht 5\' 4"  (1.626 m)   Wt 156 lb (70.8 kg)   SpO2 98%   BMI 26.78 kg/m    Objective:   Physical Exam  Constitutional: She is oriented to person, place, and time. She appears well-nourished.  HENT:  Right Ear: Tympanic membrane and ear canal normal.  Left Ear: Tympanic membrane and ear canal normal.  Mouth/Throat: Oropharynx is clear and moist.  Eyes: Pupils are equal, round, and reactive to light. EOM are normal.  Neck: Neck supple.  Cardiovascular: Normal rate and regular rhythm.  Respiratory: Effort normal and breath sounds normal.  GI: Soft. Bowel sounds are normal. There is no abdominal tenderness.  Musculoskeletal: Normal range of motion.  Neurological: She is alert and oriented to person, place, and time.  Skin: Skin is warm and dry.  Psychiatric: She has a normal mood and affect.           Assessment & Plan:

## 2019-02-03 NOTE — Assessment & Plan Note (Signed)
Taking levothyroxine appropriately for the most part. Discussed proper instructions for taking. Recent TSH stable.  Continue levothyroxine 88 mcg.

## 2019-02-03 NOTE — Patient Instructions (Signed)
Start exercising. You should be getting 150 minutes of weight bearing exercise weekly.  It's important to improve your diet by reducing consumption of fast food, fried food, processed snack foods, sugary drinks. Increase consumption of fresh vegetables and fruits, whole grains, water.  Ensure you are drinking 64 ounces of water daily.  Add in Calcium 1200 mg and Vitamin D 800 units daily for bone strength.   It was a pleasure to see you today!   Preventive Care 3 Years and Older, Female Preventive care refers to lifestyle choices and visits with your health care provider that can promote health and wellness. This includes:  A yearly physical exam. This is also called an annual well check.  Regular dental and eye exams.  Immunizations.  Screening for certain conditions.  Healthy lifestyle choices, such as diet and exercise. What can I expect for my preventive care visit? Physical exam Your health care provider will check:  Height and weight. These may be used to calculate body mass index (BMI), which is a measurement that tells if you are at a healthy weight.  Heart rate and blood pressure.  Your skin for abnormal spots. Counseling Your health care provider may ask you questions about:  Alcohol, tobacco, and drug use.  Emotional well-being.  Home and relationship well-being.  Sexual activity.  Eating habits.  History of falls.  Memory and ability to understand (cognition).  Work and work Statistician.  Pregnancy and menstrual history. What immunizations do I need?  Influenza (flu) vaccine  This is recommended every year. Tetanus, diphtheria, and pertussis (Tdap) vaccine  You may need a Td booster every 10 years. Varicella (chickenpox) vaccine  You may need this vaccine if you have not already been vaccinated. Zoster (shingles) vaccine  You may need this after age 48. Pneumococcal conjugate (PCV13) vaccine  One dose is recommended after age 22.  Pneumococcal polysaccharide (PPSV23) vaccine  One dose is recommended after age 2. Measles, mumps, and rubella (MMR) vaccine  You may need at least one dose of MMR if you were born in 1957 or later. You may also need a second dose. Meningococcal conjugate (MenACWY) vaccine  You may need this if you have certain conditions. Hepatitis A vaccine  You may need this if you have certain conditions or if you travel or work in places where you may be exposed to hepatitis A. Hepatitis B vaccine  You may need this if you have certain conditions or if you travel or work in places where you may be exposed to hepatitis B. Haemophilus influenzae type b (Hib) vaccine  You may need this if you have certain conditions. You may receive vaccines as individual doses or as more than one vaccine together in one shot (combination vaccines). Talk with your health care provider about the risks and benefits of combination vaccines. What tests do I need? Blood tests  Lipid and cholesterol levels. These may be checked every 5 years, or more frequently depending on your overall health.  Hepatitis C test.  Hepatitis B test. Screening  Lung cancer screening. You may have this screening every year starting at age 68 if you have a 30-pack-year history of smoking and currently smoke or have quit within the past 15 years.  Colorectal cancer screening. All adults should have this screening starting at age 20 and continuing until age 78. Your health care provider may recommend screening at age 42 if you are at increased risk. You will have tests every 1-10 years, depending on your  results and the type of screening test.  Diabetes screening. This is done by checking your blood sugar (glucose) after you have not eaten for a while (fasting). You may have this done every 1-3 years.  Mammogram. This may be done every 1-2 years. Talk with your health care provider about how often you should have regular mammograms.   BRCA-related cancer screening. This may be done if you have a family history of breast, ovarian, tubal, or peritoneal cancers. Other tests  Sexually transmitted disease (STD) testing.  Bone density scan. This is done to screen for osteoporosis. You may have this done starting at age 34. Follow these instructions at home: Eating and drinking  Eat a diet that includes fresh fruits and vegetables, whole grains, lean protein, and low-fat dairy products. Limit your intake of foods with high amounts of sugar, saturated fats, and salt.  Take vitamin and mineral supplements as recommended by your health care provider.  Do not drink alcohol if your health care provider tells you not to drink.  If you drink alcohol: ? Limit how much you have to 0-1 drink a day. ? Be aware of how much alcohol is in your drink. In the [place] one drink equals one 12 oz bottle of beer (355 mL), one 5 oz glass of wine (148 mL), or one 1 oz glass of hard liquor (44 mL). Lifestyle  Take daily care of your teeth and gums.  Stay active. Exercise for at least 30 minutes on 5 or more days each week.  Do not use any products that contain nicotine or tobacco, such as cigarettes, e-cigarettes, and chewing tobacco. If you need help quitting, ask your health care provider.  If you are sexually active, practice safe sex. Use a condom or other form of protection in order to prevent STIs (sexually transmitted infections).  Talk with your health care provider about taking a low-dose aspirin or statin. What's next?  Go to your health care provider once a year for a well check visit.  Ask your health care provider how often you should have your eyes and teeth checked.  Stay up to date on all vaccines. This information is not intended to replace advice given to you by your health care provider. Make sure you discuss any questions you have with your health care provider. Document Released: 05/14/2015 Document Revised: 04/11/2018  Document Reviewed: 04/11/2018 Elsevier Patient Education  2020 [employer].

## 2019-03-20 ENCOUNTER — Other Ambulatory Visit: Payer: Self-pay | Admitting: Primary Care

## 2019-03-20 DIAGNOSIS — M159 Polyosteoarthritis, unspecified: Secondary | ICD-10-CM

## 2019-03-20 DIAGNOSIS — I10 Essential (primary) hypertension: Secondary | ICD-10-CM

## 2019-03-20 DIAGNOSIS — E039 Hypothyroidism, unspecified: Secondary | ICD-10-CM

## 2019-03-20 DIAGNOSIS — E785 Hyperlipidemia, unspecified: Secondary | ICD-10-CM

## 2019-03-20 DIAGNOSIS — M8949 Other hypertrophic osteoarthropathy, multiple sites: Secondary | ICD-10-CM

## 2019-03-25 ENCOUNTER — Other Ambulatory Visit: Payer: Self-pay | Admitting: Primary Care

## 2019-03-25 DIAGNOSIS — G629 Polyneuropathy, unspecified: Secondary | ICD-10-CM

## 2019-03-25 MED ORDER — LYRICA 150 MG PO CAPS: ORAL_CAPSULE | ORAL | 0 refills | Status: DC

## 2019-03-25 NOTE — Telephone Encounter (Signed)
Noted, refill sent to pharmacy. 

## 2019-03-25 NOTE — Telephone Encounter (Signed)
Last prescribed on 12/20/2018 . Last appointment on 02/03/2019. Next future appointment on 02/03/2020

## 2019-05-06 ENCOUNTER — Telehealth: Payer: Self-pay | Admitting: Primary Care

## 2019-05-06 NOTE — Telephone Encounter (Signed)
Per DPR, left detail message of Kate Clark's comments for patient. 

## 2019-05-06 NOTE — Telephone Encounter (Signed)
Please notify patient that I do recommend the Covid vaccine, unfortunately we do not have any vaccinations in our clinic.  I am happy to meet with her either virtually or in person to discuss her chronic cough.

## 2019-05-06 NOTE — Telephone Encounter (Signed)
Tried to call patient but she hung up th phone

## 2019-05-06 NOTE — Telephone Encounter (Signed)
Patient called in regards to the covid vaccine and her COPD    First, Patient would like to know if you recommend her getting the covid vaccine due to her age and health.   Second, Patient stated that she has COPD. She has a lingering cough and she has to clear her throat for a long duration in the mornings.She would like to know if you have any advice on what she should do to help with this.

## 2019-05-26 ENCOUNTER — Ambulatory Visit: Payer: Medicare Other | Attending: Internal Medicine

## 2019-05-26 DIAGNOSIS — Z23 Encounter for immunization: Secondary | ICD-10-CM

## 2019-05-26 NOTE — Progress Notes (Signed)
   Covid-19 Vaccination Clinic  Name:  Michelle Ball    MRN: [id number] DOB: 1932/07/28  05/26/2019  Michelle Ball was observed post Covid-19 immunization for 15 minutes without incidence. She was provided with Vaccine Information Sheet and instruction to access the V-Safe system.   Michelle Ball was instructed to call 911 with any severe reactions post vaccine: Marland Kitchen Difficulty breathing  . Swelling of your face and throat  . A fast heartbeat  . A bad rash all over your body  . Dizziness and weakness    Immunizations Administered    Name Date Dose VIS Date Route   Pfizer COVID-19 Vaccine 05/26/2019  8:50 AM 0.3 mL 04/11/2019 Intramuscular   Manufacturer: [place]   Lot: [id number]   [place]: [id number]

## 2019-06-16 ENCOUNTER — Ambulatory Visit: Payer: Medicare Other | Attending: Internal Medicine

## 2019-06-16 DIAGNOSIS — Z23 Encounter for immunization: Secondary | ICD-10-CM | POA: Insufficient documentation

## 2019-06-16 NOTE — Progress Notes (Signed)
   Covid-19 Vaccination Clinic  Name:  Michelle Ball    MRN: [id number] DOB: Feb 17, 1933  06/16/2019  Michelle Ball was observed post Covid-19 immunization for 15 minutes without incidence. She was provided with Vaccine Information Sheet and instruction to access the V-Safe system.   Michelle Ball was instructed to call 911 with any severe reactions post vaccine: Marland Kitchen Difficulty breathing  . Swelling of your face and throat  . A fast heartbeat  . A bad rash all over your body  . Dizziness and weakness    Immunizations Administered    Name Date Dose VIS Date Route   Pfizer COVID-19 Vaccine 06/16/2019  9:14 AM 0.3 mL 04/11/2019 Intramuscular   Manufacturer: [place]   Lot: [id number]   [place]: [id number]

## 2019-06-19 ENCOUNTER — Other Ambulatory Visit: Payer: Self-pay | Admitting: Primary Care

## 2019-06-19 DIAGNOSIS — M810 Age-related osteoporosis without current pathological fracture: Secondary | ICD-10-CM

## 2019-07-09 ENCOUNTER — Ambulatory Visit: Payer: Medicare Other | Admitting: *Deleted

## 2019-07-09 ENCOUNTER — Other Ambulatory Visit: Payer: Self-pay

## 2019-07-09 VITALS — BP 158/89 | HR 76

## 2019-07-09 DIAGNOSIS — I10 Essential (primary) hypertension: Secondary | ICD-10-CM

## 2019-07-09 DIAGNOSIS — Z0130 Encounter for examination of blood pressure without abnormal findings: Secondary | ICD-10-CM

## 2019-07-09 NOTE — Progress Notes (Signed)
Pt arrived to [facility]. Pt alert and oriented and arrives in good spirits.She request a blood pressure check today.  Pt denies chest pain, SOB, HA, dizziness, or blurred vision. Verified medication. Pt states medication was taken this morning.  Blood pressure reading: 158/89 Patient states she has a home blood pressure monitor. Advised how to check blood pressures at home.  Patient verbalized understanding.

## 2019-07-15 ENCOUNTER — Other Ambulatory Visit: Payer: Self-pay | Admitting: Primary Care

## 2019-07-15 DIAGNOSIS — G629 Polyneuropathy, unspecified: Secondary | ICD-10-CM

## 2019-07-15 NOTE — Telephone Encounter (Signed)
Last prescribed on 03/25/2019 . Last appointment on 02/03/2019. Next future appointment on 02/06/2020

## 2019-07-15 NOTE — Telephone Encounter (Signed)
Noted. Refill sent to pharmacy. Will discuss reducing dose at next office visit.

## 2019-09-16 ENCOUNTER — Other Ambulatory Visit: Payer: Self-pay | Admitting: Primary Care

## 2019-09-16 DIAGNOSIS — E785 Hyperlipidemia, unspecified: Secondary | ICD-10-CM

## 2019-09-16 DIAGNOSIS — M8949 Other hypertrophic osteoarthropathy, multiple sites: Secondary | ICD-10-CM

## 2019-09-16 DIAGNOSIS — M159 Polyosteoarthritis, unspecified: Secondary | ICD-10-CM

## 2019-09-16 DIAGNOSIS — I10 Essential (primary) hypertension: Secondary | ICD-10-CM

## 2019-09-16 DIAGNOSIS — E039 Hypothyroidism, unspecified: Secondary | ICD-10-CM

## 2019-10-15 ENCOUNTER — Telehealth: Payer: Self-pay | Admitting: *Deleted

## 2019-10-15 DIAGNOSIS — G629 Polyneuropathy, unspecified: Secondary | ICD-10-CM

## 2019-10-15 NOTE — Telephone Encounter (Signed)
I am happy to see her to discuss further if she would like. The only other alternatives to Lyrica is gabapentin.  I prefer gabapentin over Lyrica in general.

## 2019-10-15 NOTE — Telephone Encounter (Signed)
Patient left a voicemail stating that she has not seen Allie Bossier NP for a while and not due to see her until the fall. Patient stated that she is due for a refill on Lyrica. Patient stated that she wants to know if there is anything better on the market now for neuropathy other than Lyrica. Patient stated that her neuropathy is not any worse, but not any better either.

## 2019-10-16 MED ORDER — LYRICA 150 MG PO CAPS: ORAL_CAPSULE | ORAL | 0 refills | Status: DC

## 2019-10-16 NOTE — Telephone Encounter (Signed)
Spoken and notified patient of Michelle Millers comments. Patient stated that she had tried gabapentin before and did not work well for her, had a lot pain. She will continue the Lyrica. Patient asked for a refill. Medicare [facility]ness is scheduled on 02/06/2020  Last prescribed on 07/15/2019 #180 with 0 refill Last seen on 02/03/2019 (cpe)

## 2019-10-16 NOTE — Telephone Encounter (Signed)
Refills sent to pharmacy. 

## 2019-12-01 ENCOUNTER — Other Ambulatory Visit: Payer: Self-pay | Admitting: Primary Care

## 2019-12-01 ENCOUNTER — Telehealth: Payer: Self-pay | Admitting: *Deleted

## 2019-12-01 DIAGNOSIS — Z1231 Encounter for screening mammogram for malignant neoplasm of breast: Secondary | ICD-10-CM

## 2019-12-01 NOTE — Telephone Encounter (Signed)
Patient left a voicemail stating that she is trying to get her mammogram scheduled and she was told that her PCP needs to order it. Patient uses [facility]

## 2019-12-02 NOTE — Telephone Encounter (Signed)
Noted, orders placed. 

## 2019-12-09 ENCOUNTER — Telehealth: Payer: Self-pay

## 2019-12-09 ENCOUNTER — Ambulatory Visit (INDEPENDENT_AMBULATORY_CARE_PROVIDER_SITE_OTHER)
Admission: RE | Admit: 2019-12-09 | Discharge: 2019-12-09 | Disposition: A | Discharge status: Home / Self Care | Payer: Medicare Other | Source: Ambulatory Visit | Attending: Family Medicine | Admitting: Family Medicine

## 2019-12-09 ENCOUNTER — Other Ambulatory Visit: Payer: Self-pay

## 2019-12-09 ENCOUNTER — Ambulatory Visit: Payer: Medicare Other | Admitting: Family Medicine

## 2019-12-09 ENCOUNTER — Encounter: Payer: Self-pay | Admitting: Family Medicine

## 2019-12-09 VITALS — BP 160/80 | HR 66 | Temp 97.5°F | Ht 64.0 in | Wt 145.4 lb

## 2019-12-09 DIAGNOSIS — G629 Polyneuropathy, unspecified: Secondary | ICD-10-CM | POA: Diagnosis not present

## 2019-12-09 DIAGNOSIS — E039 Hypothyroidism, unspecified: Secondary | ICD-10-CM | POA: Diagnosis not present

## 2019-12-09 DIAGNOSIS — J449 Chronic obstructive pulmonary disease, unspecified: Secondary | ICD-10-CM

## 2019-12-09 DIAGNOSIS — R2681 Unsteadiness on feet: Secondary | ICD-10-CM | POA: Diagnosis not present

## 2019-12-09 DIAGNOSIS — I447 Left bundle-branch block, unspecified: Secondary | ICD-10-CM

## 2019-12-09 DIAGNOSIS — E785 Hyperlipidemia, unspecified: Secondary | ICD-10-CM

## 2019-12-09 DIAGNOSIS — I10 Essential (primary) hypertension: Secondary | ICD-10-CM

## 2019-12-09 LAB — CBC WITH DIFFERENTIAL/PLATELET
Basophils Absolute: 0.1 10*3/uL (ref 0.0–0.1)
Basophils Relative: 0.8 % (ref 0.0–3.0)
Eosinophils Absolute: 0.4 10*3/uL (ref 0.0–0.7)
Eosinophils Relative: 4.8 % (ref 0.0–5.0)
HCT: 42 % (ref 36.0–46.0)
Hemoglobin: 13.5 g/dL (ref 12.0–15.0)
Lymphocytes Relative: 22 % (ref 12.0–46.0)
Lymphs Abs: 1.6 10*3/uL (ref 0.7–4.0)
MCHC: 32.2 g/dL (ref 30.0–36.0)
MCV: 94.5 fl (ref 78.0–100.0)
Monocytes Absolute: 0.6 10*3/uL (ref 0.1–1.0)
Monocytes Relative: 7.9 % (ref 3.0–12.0)
Neutro Abs: 4.8 10*3/uL (ref 1.4–7.7)
Neutrophils Relative %: 64.5 % (ref 43.0–77.0)
Platelets: 209 10*3/uL (ref 150.0–400.0)
RBC: 4.45 Mil/uL (ref 3.87–5.11)
RDW: 13.5 % (ref 11.5–15.5)
WBC: 7.4 10*3/uL (ref 4.0–10.5)

## 2019-12-09 LAB — COMPREHENSIVE METABOLIC PANEL
ALT: 18 U/L (ref 0–35)
AST: 20 U/L (ref 0–37)
Albumin: 4.4 g/dL (ref 3.5–5.2)
Alkaline Phosphatase: 51 U/L (ref 39–117)
BUN: 22 mg/dL (ref 6–23)
CO2: 32 mEq/L (ref 19–32)
Calcium: 9.8 mg/dL (ref 8.4–10.5)
Chloride: 100 mEq/L (ref 96–112)
Creatinine, Ser: 0.56 mg/dL (ref 0.40–1.20)
GFR: 102.77 mL/min (ref >60.00)
Glucose, Bld: 81 mg/dL (ref 70–99)
Potassium: 4.1 mEq/L (ref 3.5–5.1)
Sodium: 140 mEq/L (ref 135–145)
Total Bilirubin: 0.4 mg/dL (ref 0.2–1.2)
Total Protein: 7 g/dL (ref 6.0–8.3)

## 2019-12-09 LAB — LIPID PANEL
Cholesterol: 156 mg/dL (ref 0–200)
HDL: 56 mg/dL (ref >39.00)
LDL Cholesterol: 74 mg/dL (ref 0–99)
NonHDL: 99.9
Total CHOL/HDL Ratio: 3
Triglycerides: 132 mg/dL (ref 0.0–149.0)
VLDL: 26.4 mg/dL (ref 0.0–40.0)

## 2019-12-09 LAB — TSH: TSH: 1.33 u[IU]/mL (ref 0.35–4.50)

## 2019-12-09 MED ORDER — SPIRIVA HANDIHALER 18 MCG IN CAPS
18.0000 ug | ORAL_CAPSULE | Freq: Every day | RESPIRATORY_TRACT | 3 refills | Status: DC
Start: 2019-12-09 — End: 2020-04-07

## 2019-12-09 MED ORDER — PREGABALIN 100 MG PO CAPS: 100.0000 mg | ORAL_CAPSULE | Freq: Two times a day (BID) | ORAL | 1 refills | Status: DC

## 2019-12-09 NOTE — Patient Instructions (Addendum)
Try lower lyrica dose - 100mg  twice daily sent to pharmacy.  We will refer you to physical therapy for fall prevention and balance training program.  Ambulatory pulse ox today. Your oxygen levels do drop some while walking. I'd like you to start spiriva for breathing and if persistent trouble you may need to discuss using oxygen with exertion.  Chest xray today.  EKG today  Labs today - we may be changing your blood pressure medicine depending on results of labs. Start checking blood pressures at home and keep a log.  Return in 1 month for follow up visit with Anda Kraft.

## 2019-12-09 NOTE — Telephone Encounter (Signed)
Pt left v/m that she had been seen earlier today and picked up the spiriva with inhaler and instructions are use once daily and pt wants to know what Dr Darnell Level suggest for the time of day that is best to use the spiriva. Pt request cb.

## 2019-12-09 NOTE — Progress Notes (Signed)
This visit was conducted in person.  BP (!) 160/80 (BP Location: Left Arm, Patient Position: Sitting, Cuff Size: Normal)   Pulse 66   Temp (!) 97.5 F (36.4 C) (Temporal)   Ht 5\' 4"  (1.626 m)   Wt 145 lb 6 oz (65.9 kg)   SpO2 94%   BMI 24.95 kg/m   BP Readings from Last 3 Encounters:  12/09/19 (!) 160/80  07/09/19 (!) 158/89  02/03/19 126/82    CC: trouble walking Subjective:    Patient ID: Michelle Ball, female    DOB: 1932/07/15, 86 y.o.   MRN: [id number]  HPI: Michelle Ball is a 86 y.o. female presenting on 12/09/2019 for Gait Problem (C/o issues with being steady on her feet, fear of falling and told her gait is off.  Wants to discuss neuropathy and referral to PT. ) and COPD (Wants to discuss COPD. )   Patient of Kate's new to me presents with multiple concerns. I took care of husband prior to his passing 01/2018 (metastatic renal cancer).   HTN - on losartan 100mg  daily. Not checking pressures at home.  Bradycardia - newly noted today - improves on retesting.  Neuropathy - she is on lyrica 150mg  twice daily - finds this to be very expensive. Previously gabapentin was not as effective.  Endorses frozen shoulder symptoms, right > left, worse at night - takes meloxicam daily for this.   Known COPD (dx late 1990s), not on breathing medication. Denies significant exertional dyspnea. She does have chronic cough but without wheezing. Ex smoker - quit 2000. [facility]. Sister just diagnosed with pancreatic cancer and COPD.   Notes increased unsteadiness over the past several months - she has started using her husband's old cane and walker to get around. Finds needs to sit and rest when walking prolonged periods in store. Denies dizziness or lightheadedness or vertigo. No chest pain, tightness, palpitations.      Relevant past medical, surgical, family and social history reviewed and updated as indicated. Interim medical history  since our last visit reviewed. Allergies and medications reviewed and updated. Outpatient Medications Prior to Visit  Medication Sig Dispense Refill  . alendronate (FOSAMAX) 70 MG tablet TAKE 1 TABLET BY MOUTH ONCE WEEKLY WITH A FULL GLASS OF WATER ON AN EMPTY STOMACH. AVOID LAYING FLAT FOR 1 HOUR 12 tablet 3  . atorvastatin (LIPITOR) 20 MG tablet TAKE 1 TABLET(20 MG) BY MOUTH DAILY FOR CHOLESTEROL 90 tablet 1  . Calcium Carbonate-Vitamin D (CALCIUM-D PO) Take by mouth daily.    Marland Kitchen levothyroxine (SYNTHROID) 88 MCG tablet TAKE 1 TABLET BY MOUTH EVERY MORNING ON AN EMPTY STOMACH, NO FOOD OR OTHER MEDICATION FOR 30 MINUTES 90 tablet 1  . losartan (COZAAR) 100 MG tablet TAKE 1 TABLET BY MOUTH EVERY DAY FOR BLOOD PRESSURE 90 tablet 1  . LYRICA 150 MG capsule TAKE 1 CAPSULE(150 MG) BY MOUTH TWICE DAILY FOR NEUROPATHY 180 capsule 0  . meloxicam (MOBIC) 15 MG tablet TAKE 1 TABLET(15 MG) BY MOUTH DAILY AS NEEDED FOR PAIN 90 tablet 1  . VYZULTA 0.024 % SOLN INT 1 GTT IN OU QHS    . Multiple Vitamins-Minerals (PRESERVISION AREDS 2 PO) Take 1 capsule by mouth 2 (two) times daily.     No facility-administered medications prior to visit.     Per HPI unless specifically indicated in ROS section below Review of Systems Objective:  BP (!) 160/80 (BP Location: Left Arm, Patient Position: Sitting,  Cuff Size: Normal)   Pulse 66   Temp (!) 97.5 F (36.4 C) (Temporal)   Ht 5\' 4"  (1.626 m)   Wt 145 lb 6 oz (65.9 kg)   SpO2 94%   BMI 24.95 kg/m   Wt Readings from Last 3 Encounters:  12/09/19 145 lb 6 oz (65.9 kg)  02/03/19 156 lb (70.8 kg)  01/27/19 155 lb (70.3 kg)      Physical Exam Vitals and nursing note reviewed.  Constitutional:      Appearance: Normal appearance. She is not ill-appearing.     Comments: Unsteady on her feet - needs assistance of cane to get on exam table  Eyes:     Extraocular Movements: Extraocular movements intact.     Pupils: Pupils are equal, round, and reactive to light.    Cardiovascular:     Rate and Rhythm: Regular rhythm. Bradycardia present.     Pulses: Normal pulses.     Heart sounds: Murmur (2/6 systolic at USB) heard.   Pulmonary:     Effort: Pulmonary effort is normal. No respiratory distress.     Breath sounds: No wheezing, rhonchi or rales.     Comments: Coarse throughout Musculoskeletal:     Right lower leg: No edema.     Left lower leg: No edema.  Skin:    General: Skin is warm and dry.     Findings: No rash.  Neurological:     General: No focal deficit present.     Mental Status: She is alert.     Comments:  Unsteady on feet - uses cane CN 2-12 intact FTN intact EOMI  Unsteady with romberg No pronator drift  Psychiatric:        Mood and Affect: Mood normal.        Behavior: Behavior normal.       Results for orders placed or performed in visit on 12/09/19  Lipid panel  Result Value Ref Range   Cholesterol 156 0 - 200 mg/dL   Triglycerides 132.0 0 - 149 mg/dL   HDL 56.00 >39.00 mg/dL   VLDL 26.4 0.0 - 40.0 mg/dL   LDL Cholesterol 74 0 - 99 mg/dL   Total CHOL/HDL Ratio 3    NonHDL 99.90   Comprehensive metabolic panel  Result Value Ref Range   Sodium 140 135 - 145 mEq/L   Potassium 4.1 3.5 - 5.1 mEq/L   Chloride 100 96 - 112 mEq/L   CO2 32 19 - 32 mEq/L   Glucose, Bld 81 70 - 99 mg/dL   BUN 22 6 - 23 mg/dL   Creatinine, Ser 0.56 0.40 - 1.20 mg/dL   Total Bilirubin 0.4 0.2 - 1.2 mg/dL   Alkaline Phosphatase 51 39 - 117 U/L   AST 20 0 - 37 U/L   ALT 18 0 - 35 U/L   Total Protein 7.0 6.0 - 8.3 g/dL   Albumin 4.4 3.5 - 5.2 g/dL   GFR 102.77 >60.00 mL/min   Calcium 9.8 8.4 - 10.5 mg/dL  TSH  Result Value Ref Range   TSH 1.33 0.35 - 4.50 uIU/mL  CBC with Differential/Platelet  Result Value Ref Range   WBC 7.4 4.0 - 10.5 K/uL   RBC 4.45 3.87 - 5.11 Mil/uL   Hemoglobin 13.5 12.0 - 15.0 g/dL   HCT 42.0 36 - 46 %   MCV 94.5 78.0 - 100.0 fl   MCHC 32.2 30.0 - 36.0 g/dL   RDW 13.5 11.5 - 15.5 %  Platelets 209.0  150 - 400 K/uL   Neutrophils Relative % 64.5 43 - 77 %   Lymphocytes Relative 22.0 12 - 46 %   Monocytes Relative 7.9 3 - 12 %   Eosinophils Relative 4.8 0 - 5 %   Basophils Relative 0.8 0 - 3 %   Neutro Abs 4.8 1.4 - 7.7 K/uL   Lymphs Abs 1.6 0.7 - 4.0 K/uL   Monocytes Absolute 0.6 0 - 1 K/uL   Eosinophils Absolute 0.4 0 - 0 K/uL   Basophils Absolute 0.1 0 - 0 K/uL   EKG - LBBB, 1st degree AV block, no old to compare Assessment & Plan:  This visit occurred during the SARS-CoV-2 public health emergency.  Safety protocols were in place, including screening questions prior to the visit, additional usage of staff PPE, and extensive cleaning of exam room while observing appropriate contact time as indicated for disinfecting solutions.   Problem List Items Addressed This Visit    Unsteadiness on feet - Primary    Anticipate multifactorial - including known neuropathy, polypharmacy and deconditioning. Also with evidence of mild exertional hypoxia. Suggested slow taper off lyrica (becoming unaffordable) - this may help balance. She has started using husband's old cane and walker - I will refer to outpatient PT for ambulatory assistive device fitting, and for balance training/fall prevention program. With her osteoporosis she is high risk of complications from fall.       Relevant Orders   Lipid panel (Completed)   Comprehensive metabolic panel (Completed)   TSH (Completed)   CBC with Differential/Platelet (Completed)   Ambulatory referral to Physical Therapy   Neuropathy    Gabapentin not effective. Lyrica now too expensive, ?contributing to unsteadiness. See below for lyrica plan.  Could consider low dose TCA like notriptyline      Relevant Orders   Ambulatory referral to Physical Therapy   LBBB (left bundle branch block)    Newly noted of unknown chronicity, with 1st degree AVB - avoid AV nodal blocking agents      Hypothyroidism    Update levels on her regular levothyroxine dose.        Hyperlipidemia    Update labs on lipitor daily. The ASCVD Risk score Mikey Bussing DC Jr., et al., 2013) failed to calculate for the following reasons:   The 2013 ASCVD risk score is only valid for ages 33 to 81       Essential hypertension    Deteriorated, above goal despite daily losartan. I have asked her to start monitoring blood pressures at home, if consistently >150 at home, low threshold to consider additional medication.       COPD (chronic obstructive pulmonary disease) (HCC)    Chronic, longstanding, hasn't previously needed daily respiratory medication but now endorsing worsening exertional stamina (without frank dyspnea). Not consistent with COPD exacerbation. Start spiriva, reassess at f/u with PCP next month. Check CXR. Drops saturations to 87% on ambulatory pulse ox today, quickly recovers with rest.       Relevant Medications   tiotropium (SPIRIVA HANDIHALER) 18 MCG inhalation capsule   Other Relevant Orders   DG Chest 2 View (Completed)   EKG 12-Lead (Completed)   Ambulatory referral to Physical Therapy       Meds ordered this encounter  Medications  . pregabalin (LYRICA) 100 MG capsule    Sig: Take 1 capsule (100 mg total) by mouth 2 (two) times daily.    Dispense:  60 capsule    Refill:  1  To replace 150mg  dose  . tiotropium (SPIRIVA HANDIHALER) 18 MCG inhalation capsule    Sig: Place 1 capsule (18 mcg total) into inhaler and inhale daily.    Dispense:  30 capsule    Refill:  3   Orders Placed This Encounter  Procedures  . DG Chest 2 View    Standing Status:   Future    Number of Occurrences:   1    Standing Expiration Date:   12/08/2020    Order Specific Question:   Reason for Exam (SYMPTOM  OR DIAGNOSIS REQUIRED)    Answer:   COPD, hypoxia    Order Specific Question:   Preferred imaging location?    Answer:   [facility]    Order Specific Question:   Radiology Contrast Protocol - do NOT remove file path    Answer:    [email or web address]  . Lipid panel  . Comprehensive metabolic panel  . TSH  . CBC with Differential/Platelet  . Ambulatory referral to Physical Therapy    Referral Priority:   Routine    Referral Type:   Physical Medicine    Referral Reason:   Specialty Services Required    Requested Specialty:   Physical Therapy    Number of Visits Requested:   1  . EKG 12-Lead    Patient Instructions  Try lower lyrica dose - 100mg  twice daily sent to pharmacy.  We will refer you to physical therapy for fall prevention and balance training program.  Ambulatory pulse ox today. Your oxygen levels do drop some while walking. I'd like you to start spiriva for breathing and if persistent trouble you may need to discuss using oxygen with exertion.  Chest xray today.  EKG today  Labs today - we may be changing your blood pressure medicine depending on results of labs. Start checking blood pressures at home and keep a log.  Return in 1 month for follow up visit with Anda Kraft.   Follow up plan: Return in about 4 weeks (around 01/06/2020) for follow up visit.  Ria Bush, MD

## 2019-12-09 NOTE — Telephone Encounter (Signed)
Recommend she take this at night time. Thanks.

## 2019-12-09 NOTE — Telephone Encounter (Signed)
Spoke with pt relaying Dr. G's message. Pt verbalizes understanding.  

## 2019-12-10 ENCOUNTER — Telehealth: Payer: Self-pay | Admitting: Primary Care

## 2019-12-10 NOTE — Telephone Encounter (Signed)
Pt called asking about referral Dr. Darnell Level was placing to Physical Therapy but I don't see the referral in system. It shows on her AVS but not in the the referrals section. Am I looking at this wrong? I let the patient know to give [place] a week before calling about the referral and she was ok with that.

## 2019-12-11 ENCOUNTER — Encounter: Payer: Self-pay | Admitting: Family Medicine

## 2019-12-11 DIAGNOSIS — M419 Scoliosis, unspecified: Secondary | ICD-10-CM | POA: Insufficient documentation

## 2019-12-11 DIAGNOSIS — I700 Atherosclerosis of aorta: Secondary | ICD-10-CM | POA: Insufficient documentation

## 2019-12-13 DIAGNOSIS — R0609 Other forms of dyspnea: Secondary | ICD-10-CM | POA: Insufficient documentation

## 2019-12-13 DIAGNOSIS — R0600 Dyspnea, unspecified: Secondary | ICD-10-CM | POA: Insufficient documentation

## 2019-12-13 DIAGNOSIS — I447 Left bundle-branch block, unspecified: Secondary | ICD-10-CM | POA: Insufficient documentation

## 2019-12-13 DIAGNOSIS — R2681 Unsteadiness on feet: Secondary | ICD-10-CM | POA: Insufficient documentation

## 2019-12-13 NOTE — Assessment & Plan Note (Signed)
Deteriorated, above goal despite daily losartan. I have asked her to start monitoring blood pressures at home, if consistently >150 at home, low threshold to consider additional medication.

## 2019-12-13 NOTE — Assessment & Plan Note (Addendum)
Chronic, longstanding, hasn't previously needed daily respiratory medication but now endorsing worsening exertional stamina (without frank dyspnea). Not consistent with COPD exacerbation. Start spiriva, reassess at f/u with PCP next month. Check CXR. Drops saturations to 87% on ambulatory pulse ox today, quickly recovers with rest.

## 2019-12-13 NOTE — Assessment & Plan Note (Signed)
Anticipate multifactorial - including known neuropathy, polypharmacy and deconditioning. Also with evidence of mild exertional hypoxia. Suggested slow taper off lyrica (becoming unaffordable) - this may help balance. She has started using husband's old cane and walker - I will refer to outpatient PT for ambulatory assistive device fitting, and for balance training/fall prevention program. With her osteoporosis she is high risk of complications from fall.

## 2019-12-13 NOTE — Assessment & Plan Note (Signed)
Update labs on lipitor daily. The ASCVD Risk score Michelle Ball DC Jr., et al., 2013) failed to calculate for the following reasons:   The 2013 ASCVD risk score is only valid for ages 58 to 83

## 2019-12-13 NOTE — Assessment & Plan Note (Signed)
Gabapentin not effective. Lyrica now too expensive, ?contributing to unsteadiness. See below for lyrica plan.  Could consider low dose TCA like notriptyline

## 2019-12-13 NOTE — Assessment & Plan Note (Signed)
Update levels on her regular levothyroxine dose.

## 2019-12-13 NOTE — Assessment & Plan Note (Signed)
Newly noted of unknown chronicity, with 1st degree AVB - avoid AV nodal blocking agents

## 2019-12-15 ENCOUNTER — Telehealth: Payer: Self-pay

## 2019-12-15 NOTE — Telephone Encounter (Signed)
Lvm asking pt to call back.  Need to relay results and Dr. Synthia Innocent message.  Labs & Dr. Synthia Innocent msg: Your labwork returned largely normal. We are working on PT referral for balance training.  I had asked her to start monitoring BPs at home - how are they running?  If elevated, low threshold to add second BP med like amlodipine.  How is breathing since starting Spiriva?  If no better, we may need to consider supplemental oxygen.  Discuss with PCP at 1 mo f/u visit.

## 2019-12-16 NOTE — Telephone Encounter (Signed)
Spoke with Michelle Ball relaying results and Dr. Synthia Innocent message.  She verbalizes understanding.  Reports the following BP readings: 8/11- 162/80 8/12-  174/83 8/13- 169/85 8/15- 120/65 in AM, 139/69 in afternoon  Michelle Ball says breathing is much better since starting Spiriva.

## 2019-12-16 NOTE — Telephone Encounter (Signed)
Pt returned your call Best number [phone]

## 2019-12-18 NOTE — Telephone Encounter (Signed)
Glad breathing is better. Hopefully PT will help as well.  Recommend she continue checking BP at home and call us with some more readings in 1-2 wks.

## 2019-12-19 NOTE — Telephone Encounter (Signed)
Patient notified as instructed by telephone and verbalized understanding. Patient stated that she cut her Lyrica back as she was told. Patient stated that she is taking Lyrica 100 mg twice a day. Patient stated that she wakes up during the night about every hour and has to walk around because of the pain and burning in her legs and feet from neuropathy. Patient wants to know if she should increase the Lyrica back to what she was taking previously?

## 2019-12-21 NOTE — Telephone Encounter (Signed)
If she still has 150mg  dose would have her try 150mg  at night time with 100mg  during the day. If she no longer has 150mg  dose, would have her take 100mg  in am and 200mg  in pm. Update [place] with effect. Has f/u with PCP early Sept.

## 2019-12-22 NOTE — Telephone Encounter (Signed)
Noted and agree. 

## 2019-12-23 NOTE — Telephone Encounter (Signed)
Spoke with pt relaying Dr. Synthia Innocent message.  Pt states she does have 150 mg caps. She verbalizes Dr. Synthia Innocent instructions and expresses her thanks.

## 2020-01-06 ENCOUNTER — Encounter: Payer: Self-pay | Admitting: Primary Care

## 2020-01-06 ENCOUNTER — Ambulatory Visit: Payer: Medicare Other | Admitting: Primary Care

## 2020-01-06 ENCOUNTER — Other Ambulatory Visit: Payer: Self-pay

## 2020-01-06 VITALS — BP 138/74 | HR 90 | Ht 64.0 in | Wt 147.0 lb

## 2020-01-06 DIAGNOSIS — I10 Essential (primary) hypertension: Secondary | ICD-10-CM

## 2020-01-06 DIAGNOSIS — R2681 Unsteadiness on feet: Secondary | ICD-10-CM

## 2020-01-06 DIAGNOSIS — G629 Polyneuropathy, unspecified: Secondary | ICD-10-CM

## 2020-01-06 DIAGNOSIS — J449 Chronic obstructive pulmonary disease, unspecified: Secondary | ICD-10-CM

## 2020-01-06 DIAGNOSIS — E039 Hypothyroidism, unspecified: Secondary | ICD-10-CM

## 2020-01-06 MED ORDER — NORTRIPTYLINE HCL 10 MG PO CAPS: ORAL_CAPSULE | ORAL | 0 refills | Status: DC

## 2020-01-06 MED ORDER — PREGABALIN 50 MG PO CAPS: ORAL_CAPSULE | ORAL | 0 refills | Status: DC

## 2020-01-06 MED ORDER — BUDESONIDE-FORMOTEROL FUMARATE 160-4.5 MCG/ACT IN AERO: 2.0000 | INHALATION_SPRAY | Freq: Two times a day (BID) | RESPIRATORY_TRACT | 3 refills | Status: DC

## 2020-01-06 NOTE — Patient Instructions (Signed)
Continue using Spiriva inhaler at bedtime for COPD.  Start Symbicort inhaler. Inhale 2 puffs into the lungs twice daily, everyday. It may take a few weeks to be fully effective.  We are weaning your off of the Lyrica. Take 2 capsules twice daily for 2 weeks, then 1 capsule twice daily for 2 weeks, then 1 capsule once daily for 2 weeks, then stop.  Start nortriptyline 10 mg for pain. Take 1 capsule nightly for one week, then increase to 2 capsules nightly thereafter.  Please update me regarding your breathing, throat clearing, and neuropathy pain in a few weeks.  It was a pleasure to see you today!

## 2020-01-06 NOTE — Assessment & Plan Note (Signed)
Recent TSH at goal. Continue levothyroxine 88 mcg. 

## 2020-01-06 NOTE — Assessment & Plan Note (Signed)
Agree that symptoms of throat clearing and exertional dyspnea are secondary to COPD.   Continue Spiriva. Rx for Symbicort sent to pharmacy.  She will update.

## 2020-01-06 NOTE — Assessment & Plan Note (Signed)
Agree to weaning off Lyrica due to imbalance and age.  Reduce to 100 mg BID for 2 weeks, then 50 mg BID for 2 weeks, then 50 mg daily for 2 weeks then stop.  In the mean time will initiate nortriptyline 10 mg nightly for 1 week, then increase to 20 mg nightly thereafter. I asked for her to update [place] in 2 weeks. Consider dose increase if needed.

## 2020-01-06 NOTE — Progress Notes (Signed)
Subjective:    Patient ID: Michelle Ball, female    DOB: 01-09-1933, 86 y.o.   MRN: [id number]  HPI  This visit occurred during the SARS-CoV-2 public health emergency.  Safety protocols were in place, including screening questions prior to the visit, additional usage of staff PPE, and extensive cleaning of exam room while observing appropriate contact time as indicated for disinfecting solutions.   Michelle Ball is a 86 year old female with a history of hypertension, atherosclerosis of aorta, COPD, GERD, hypothyroidism, neuropathy, osteoporosis who presents today for follow up of unsteadiness.   Evaluated by Dr. Danise Mina on 12/09/19 for numerous symptoms including neuropathy, hypertension, frozen shoulder symptoms, unsteadiness over the last several months, inability to afford Lyrica. Room air saturation dropped to 87% with ambulation. Chest xray showed cardiomegaly and aortic atherosclerosis.   During that visit she was encouraged to start checking blood pressure levels at home given elevated reading. Was told to speak with PCP regarding Lyrica and cost, nortriptyline was recommended but not prescribed. She was initiated on Spiriva for exertional dyspnea. Unsteadiness was suspected to be secondary to Lyrica dose which was reduced to 100 mg BID, she was also referred to outpatient PT for balance. ECG showed new LBBB.  Since her last visit she's been checking her BP at home which is running 140's-150's/80's. She has been taking Lyrica 100 mg during the day and 150 mg at night but doesn't believe she can afford this. She has been participating in outpatient physical therapy, believes this has been helpful. She has been using a cane and walker for balance.   Without Lyrica she will experience a burning and numbness sensation that is mostly bothersome at night. During the day she tries to remain active which helps symptoms. During the night she can lay for about 1 hour before her legs begin  "throbbing".   Over the last 10 years her daughter has noticed that she constantly clears her throat in the morning and with exercise. She now has noticed significant improvement in sputum production at night, but continues to notice having to clear her throat during the day and feel exertional dyspnea during the day. She tries to stay active at the [facility], but will have to rest often due to dyspnea.    BP Readings from Last 3 Encounters:  01/06/20 138/74  12/09/19 (!) 160/80  07/09/19 (!) 158/89     Review of Systems  Constitutional: Negative for fever.  HENT: Positive for congestion.   Eyes: Negative for visual disturbance.  Respiratory: Positive for shortness of breath.   Cardiovascular: Negative for chest pain.  Neurological: Negative for dizziness and headaches.       Chronic neuropathic pain to lower extremities       Past Medical History:  Diagnosis Date   Arthritis    COPD (chronic obstructive pulmonary disease) ([place])    Environmental and seasonal allergies    Hyperlipidemia    Hypertension    Neuropathy    Thyroid disease      Social History   Socioeconomic History   Marital status: Married    Spouse name: Not on file   Number of children: Not on file   Years of education: Not on file   Highest education level: Not on file  Occupational History   Not on file  Tobacco Use   Smoking status: Former Smoker   Smokeless tobacco: Never Used  Vaping Use   Vaping Use: Never used  Substance and Sexual Activity  Alcohol use: Never   Drug use: Never   Sexual activity: Not Currently  Other Topics Concern   Not on file  Social History Narrative   Not on file   Social Determinants of Health   Financial Resource Strain: Low Risk    Difficulty of Paying Living Expenses: Not hard at all  Food Insecurity: No Food Insecurity   Worried About Charity fundraiser in the Last Year: Never true   [place] in the Last Year: Never true    Transportation Needs: No Transportation Needs   Lack of Transportation (Medical): No   Lack of Transportation (Non-Medical): No  Physical Activity: Inactive   Days of Exercise per Week: 0 days   Minutes of Exercise per Session: 0 min  Stress: No Stress Concern Present   Feeling of Stress : Not at all  Social Connections:    Frequency of Communication with Friends and Family: Not on file   Frequency of Social Gatherings with Friends and Family: Not on file   Attends Religious Services: Not on Electrical engineer or Organizations: Not on file   Attends Archivist Meetings: Not on file   Marital Status: Not on file  Intimate Partner Violence: Not At Risk   Fear of Current or Ex-Partner: No   Emotionally Abused: No   Physically Abused: No   Sexually Abused: No    Past Surgical History:  Procedure Laterality Date   ABDOMINAL HYSTERECTOMY     APPENDECTOMY     CHOLECYSTECTOMY     REPLACEMENT TOTAL KNEE Right 2016   TONSILLECTOMY AND ADENOIDECTOMY      Family History  Problem Relation Age of Onset   Arthritis Mother    Cancer Mother    Arthritis Father    Early death Father    Heart disease Father    Heart attack Father    Hyperlipidemia Father    Hypertension Father    Arthritis Sister    Depression Sister    Hypertension Sister    Hyperlipidemia Sister     No Known Allergies  Current Outpatient Medications on File Prior to Visit  Medication Sig Dispense Refill   alendronate (FOSAMAX) 70 MG tablet TAKE 1 TABLET BY MOUTH ONCE WEEKLY WITH A FULL GLASS OF WATER ON AN EMPTY STOMACH. AVOID LAYING FLAT FOR 1 HOUR 12 tablet 3   atorvastatin (LIPITOR) 20 MG tablet TAKE 1 TABLET(20 MG) BY MOUTH DAILY FOR CHOLESTEROL 90 tablet 1   Calcium Carbonate-Vitamin D (CALCIUM-D PO) Take by mouth daily.     COMBIGAN 0.2-0.5 % ophthalmic solution Apply 1 drop to eye 2 (two) times daily.     levothyroxine (SYNTHROID) 88 MCG tablet  TAKE 1 TABLET BY MOUTH EVERY MORNING ON AN EMPTY STOMACH, NO FOOD OR OTHER MEDICATION FOR 30 MINUTES 90 tablet 1   losartan (COZAAR) 100 MG tablet TAKE 1 TABLET BY MOUTH EVERY DAY FOR BLOOD PRESSURE 90 tablet 1   meloxicam (MOBIC) 15 MG tablet TAKE 1 TABLET(15 MG) BY MOUTH DAILY AS NEEDED FOR PAIN 90 tablet 1   tiotropium (SPIRIVA HANDIHALER) 18 MCG inhalation capsule Place 1 capsule (18 mcg total) into inhaler and inhale daily. 30 capsule 3   VYZULTA 0.024 % SOLN INT 1 GTT IN OU QHS     No current facility-administered medications on file prior to visit.    BP 138/74    Pulse 90    Ht 5\' 4"  (1.626 m)  Wt 147 lb (66.7 kg)    SpO2 99%    BMI 25.23 kg/m    Objective:   Physical Exam Cardiovascular:     Rate and Rhythm: Normal rate and regular rhythm.  Pulmonary:     Effort: Pulmonary effort is normal.     Breath sounds: Normal breath sounds. No wheezing.  Musculoskeletal:     Comments: Ambulates well with cane  Skin:    General: Skin is warm and dry.  Neurological:     Mental Status: She is alert and oriented to person, place, and time.  Psychiatric:        Mood and Affect: Mood normal.            Assessment & Plan:

## 2020-01-06 NOTE — Assessment & Plan Note (Signed)
Active with outpatient PT, has had one session and feels this has helped some. Continue to walk with a cane/walker.  Will be weaning off of Lyrica.

## 2020-01-06 NOTE — Assessment & Plan Note (Signed)
Improved during exam today. Will have her continue to monitor at home. Continue losartan 100 mg for now.

## 2020-01-07 NOTE — Progress Notes (Signed)
prior authorization submitted via covermymeds.  Waiting response. Key: [id number] - PA Case ID: [id number] - Rx #: [id number] Need help? Call us at [phone] Status Sent to [facility] Nortriptyline HCl 10MG  capsules Form [employer] Medicare Part D Electronic Prior Authorization Form (2017 NCPDP) Original Claim Info [phone] Provide Exception Process Printed NoticeProtected Class No Prior UseDr. Submit ePA at [email or web address] Requires Prior Authorization

## 2020-01-08 ENCOUNTER — Telehealth: Payer: Self-pay

## 2020-01-08 NOTE — Telephone Encounter (Signed)
Please have patient call insurance and ask about amitriptyline for pain. I am also interested to hear of any alternatives if they will not cover amitriptyline.

## 2020-01-08 NOTE — Telephone Encounter (Signed)
Insurance denied prior authorization for nortriptyline.  Do you have an alternative?  Or should patient call insurance?

## 2020-01-09 NOTE — Telephone Encounter (Signed)
Patient was able to get nortriptyline with a coupon and paid cash.  She is going to fill it for now and if she needs to contact insurance in the future she will.

## 2020-01-12 ENCOUNTER — Encounter: Payer: Self-pay | Admitting: Family Medicine

## 2020-01-12 ENCOUNTER — Ambulatory Visit: Payer: Medicare Other | Admitting: Family Medicine

## 2020-01-12 ENCOUNTER — Other Ambulatory Visit: Payer: Self-pay

## 2020-01-12 ENCOUNTER — Ambulatory Visit (INDEPENDENT_AMBULATORY_CARE_PROVIDER_SITE_OTHER)
Admission: RE | Admit: 2020-01-12 | Discharge: 2020-01-12 | Disposition: A | Discharge status: Home / Self Care | Payer: Medicare Other | Source: Ambulatory Visit | Attending: Family Medicine | Admitting: Family Medicine

## 2020-01-12 ENCOUNTER — Telehealth: Payer: Self-pay | Admitting: Primary Care

## 2020-01-12 VITALS — BP 130/76 | HR 88 | Temp 98.1°F | Ht 64.0 in | Wt 146.0 lb

## 2020-01-12 DIAGNOSIS — M25511 Pain in right shoulder: Secondary | ICD-10-CM

## 2020-01-12 DIAGNOSIS — M19011 Primary osteoarthritis, right shoulder: Secondary | ICD-10-CM

## 2020-01-12 DIAGNOSIS — G8929 Other chronic pain: Secondary | ICD-10-CM

## 2020-01-12 NOTE — Telephone Encounter (Signed)
This is patient that you seen in hall

## 2020-01-12 NOTE — Telephone Encounter (Signed)
Patient was told by Anda Kraft for patient to leave a message with her assistant.  Patient had an allergic reaction to Symbicort inhaler.Patient took medication for 2 days and patient had confusion,sluggish, and hard time clearing throat. Patient felt better after she stopped medication for 2 days.

## 2020-01-12 NOTE — Progress Notes (Signed)
Michelle Gorniak T. Glenden Rossell, MD, [place]  Primary Care and Sports Medicine [facility] [place], [address]  Phone: [phone]   FAX: [phone]  Michelle Ball - 86 y.o. female   MRN [id number]   Date of Birth: December 05, 1932  Date: 01/12/2020   PCP: Pleas Koch, NP   Referral: Pleas Koch, NP  Chief Complaint  Patient presents with   Shoulder Pain    Right    This visit occurred during the SARS-CoV-2 public health emergency.  Safety protocols were in place, including screening questions prior to the visit, additional usage of staff PPE, and extensive cleaning of exam room while observing appropriate contact time as indicated for disinfecting solutions.   Subjective:   Michelle Ball is a 86 y.o. very pleasant female patient with Body mass index is 25.06 kg/m. who presents with the following:  She presents with some R sided shoulder pain.  I last saw her in 2019 with some R GH OA.  Severe GH OA.  She has persistent right-sided shoulder pain and loss of motion.  She has known glenohumeral degenerative joint disease.  Her loss of motion is quite significant and she has pain in a T-shirt distribution.  She has not had any form of additional injury.  In about 2018 she was told that she had advanced arthritis and she did have an intra-articular injection while she lived in [place].  She is trying to do some water aerobics at the [facility].  She wanted to know if I thought that physical therapy would be of much benefit to her.  Review of Systems is noted in the HPI, as appropriate   Objective:   BP 130/76    Pulse 88    Temp 98.1 F (36.7 C) (Temporal)    Ht 5\' 4"  (1.626 m)    Wt 146 lb (66.2 kg)    SpO2 94%    BMI 25.06 kg/m    GEN: No acute distress; alert,appropriate. PULM: Breathing comfortably in no respiratory distress PSYCH: Normally interactive.    Right shoulder: Abduction to 85 degrees  and flexion to 85 degrees.  Minimal rotation possible.  She is neurovascularly intact with 5/5 strength in all directions  Radiology: Xrays: Shoulder series Indication: shoulder pain Findings: No evidence of occult fracture The patient has bone-on-bone pathology at the glenohumeral joint.  She has extensive subchondral sclerosis and no evident joint space remaining. Electronically Signed  By: Owens Loffler, MD On: 01/12/2020  2:20 PM EDT   Assessment and Plan:     ICD-10-CM   1. Arthritis of right glenohumeral joint  M19.011 DG Shoulder Right  2. Chronic right shoulder pain  M25.511 DG Shoulder Right   G89.29    With the extent of her glenohumeral arthritis, I am not sure that physical therapy would help all that much.  She has been to continue doing some water workouts in the [place], and I think that this will be of benefit.  It is possible she could get a slight increase in motion, but realistically her motion will be lost forever.  Meloxicam as needed at this point, she is not at this point excited about a glenohumeral joint injection.   Follow-up: No follow-ups on file.  No orders of the defined types were placed in this encounter.  Medications Discontinued During This Encounter  Medication Reason   COMBIGAN 0.2-0.5 % ophthalmic solution Completed Course   Orders Placed This Encounter  Procedures   DG Shoulder Right    Signed,  Frederico Hamman T. Myron Stankovich, MD   Outpatient Encounter Medications as of 01/12/2020  Medication Sig   alendronate (FOSAMAX) 70 MG tablet TAKE 1 TABLET BY MOUTH ONCE WEEKLY WITH A FULL GLASS OF WATER ON AN EMPTY STOMACH. AVOID LAYING FLAT FOR 1 HOUR   atorvastatin (LIPITOR) 20 MG tablet TAKE 1 TABLET(20 MG) BY MOUTH DAILY FOR CHOLESTEROL   Calcium Carbonate-Vitamin D (CALCIUM-D PO) Take by mouth daily.   levothyroxine (SYNTHROID) 88 MCG tablet TAKE 1 TABLET BY MOUTH EVERY MORNING ON AN EMPTY STOMACH, NO FOOD OR OTHER MEDICATION FOR 30 MINUTES    losartan (COZAAR) 100 MG tablet TAKE 1 TABLET BY MOUTH EVERY DAY FOR BLOOD PRESSURE   meloxicam (MOBIC) 15 MG tablet TAKE 1 TABLET(15 MG) BY MOUTH DAILY AS NEEDED FOR PAIN (Patient taking differently: Take 7.5 mg by mouth daily as needed. )   nortriptyline (PAMELOR) 10 MG capsule Take 1 capsule by mouth at bedtime for one week, then increase to 2 capsules at bedtime thereafter. Take for pain.   pregabalin (LYRICA) 50 MG capsule Take 2 capsules by mouth twice daily for 2 weeks, then 1 capsule by mouth twice daily for two weeks, then 1 capsule by mouth daily for 2 weeks.   tiotropium (SPIRIVA HANDIHALER) 18 MCG inhalation capsule Place 1 capsule (18 mcg total) into inhaler and inhale daily.   VYZULTA 0.024 % SOLN INT 1 GTT IN OU QHS   [DISCONTINUED] COMBIGAN 0.2-0.5 % ophthalmic solution Apply 1 drop to eye 2 (two) times daily.   budesonide-formoterol (SYMBICORT) 160-4.5 MCG/ACT inhaler Inhale 2 puffs into the lungs 2 (two) times daily. (Patient not taking: Reported on 01/12/2020)   No facility-administered encounter medications on file as of 01/12/2020.

## 2020-01-13 ENCOUNTER — Ambulatory Visit
Admission: RE | Admit: 2020-01-13 | Discharge: 2020-01-13 | Disposition: A | Discharge status: Home / Self Care | Payer: Medicare Other | Source: Ambulatory Visit | Attending: Primary Care | Admitting: Primary Care

## 2020-01-13 DIAGNOSIS — Z1231 Encounter for screening mammogram for malignant neoplasm of breast: Secondary | ICD-10-CM

## 2020-01-13 NOTE — Telephone Encounter (Signed)
Noted  

## 2020-01-13 NOTE — Telephone Encounter (Signed)
Please ask patient if she is willing to try different inhaler to help with her symptoms of shortness of breath and throat drainage.  I recommend fluticasone (Flovent).  She will need to rinse her mouth after each use.  Also, in regards to her constant throat drainage has she taken any medications such as Zyrtec/Allegra/Claritin?  If not and I recommend she start Zyrtec at bedtime for throat drainage.

## 2020-01-13 NOTE — Telephone Encounter (Signed)
Called patient she will start zyrtec at night. She does not want to start new inhaler at this time. She is leaving to go out of town this weekend and afraid that it may have similar side effects. If she still thinks that she needs when she returns she will let our office know.

## 2020-01-15 NOTE — Telephone Encounter (Signed)
Have her try the inhaler Symbicort again first as this will help with her breathing. Hold off on the nortriptyline for now.  Have her update me after about 1 week.

## 2020-01-15 NOTE — Telephone Encounter (Signed)
Called patient reviewed all information she will call in one week with update.

## 2020-01-15 NOTE — Telephone Encounter (Signed)
Pt called back thinking the issue may have been from the nortriptyline and not the Symbicort. She was reading up on the possible side effects of nortriptyline and realized the side effects were more in line with what she had going on. She is leaving town today. Does Anda Kraft want her to try the nortriptyline and Symbicort again when she returns from her trip?

## 2020-01-21 ENCOUNTER — Other Ambulatory Visit: Payer: Self-pay | Admitting: Primary Care

## 2020-01-21 ENCOUNTER — Telehealth: Payer: Self-pay | Admitting: *Deleted

## 2020-01-21 NOTE — Telephone Encounter (Signed)
Patient called stating that she was to update Anda Kraft after she returned home from a trip. Patient stated that she has not taken anymore of the nortriptyline and is doing fine. Patient stated that she is using the inhaler twice a day, Spiriva at night, and Lyrica 50 mg two pills twice a day. Patient stated that she seems to be doing alright on this regimen. Patient stated that she is taking her other medications as prescribed. Patient stated that the plan was to try and reduce her Lyrica dose. Patient wants to know if she should make any other adjustments on her medications to try and reduce her Lyrica since the nortriptyline did not work. Pharmacy [facility]

## 2020-01-22 ENCOUNTER — Other Ambulatory Visit: Payer: Self-pay | Admitting: Primary Care

## 2020-01-22 NOTE — Telephone Encounter (Signed)
Patient called and reviewed every thing with her. She wanted to know if she needs to replace the lyrical to help with the neuropathy.

## 2020-01-22 NOTE — Telephone Encounter (Signed)
I would like to see her come off of Lyrica completely before we try anything else for her neuropathy. I am wondering if the nortriptyline and the Lyrica interacted with one another causing her symptoms. She can try 50 mg once daily for 1 week, then every other day for 1 week then stop.

## 2020-01-22 NOTE — Telephone Encounter (Signed)
Please thank patient for the update. Have her try to further reduce her Lyrica to 50 mg once daily to see how she does. Will you have her update me in a week?

## 2020-01-23 NOTE — Telephone Encounter (Signed)
Left message to return call to our office.  

## 2020-01-23 NOTE — Telephone Encounter (Signed)
Patient returned call. Gave all information and repeated back to me. She will call and let our office know how she is doing in 2-3 weeks. Will call if any questions.

## 2020-01-27 NOTE — Telephone Encounter (Signed)
Pt called triage. She said that since reducing her Lyrica to 50 mg at bedtime her sxs have worsen. She said she thinks she needs to be on an increased dose of Lyrica or something else. Pt said she isn't sleeping at night and her neuropathy has worsened. Pt said she has been doing PT and they don't think it was the Lyrica that caused her gait issues it was her COPD, when she starts walking her O2 drops and it makes her unsteady, PT is working with her on that so she wants to see what PCP can do regarding sxs. Pt said she received a letter in the mail saying PA for nortriptyline was declined because it's not safe for pt's 65+ to take med due to side effs so pt said he refuses to take that medication. Pt said she really doesn't want to go off of the lyrica so she wanted to see if PCP would increase dose or send her something in to help.

## 2020-01-27 NOTE — Telephone Encounter (Signed)
Please thank patient for the update. Please also kindly ask her if she has been on gabapentin in the past.  Please apologize if I have already asked her this in the past.  If she has never tried gabapentin that I recommend we try this instead of Lyrica.  If she cannot take gabapentin, then she seemed to do well on Lyrica 50 mg twice daily so we can resume that dose.  My goal is to have her on the lowest effective dose possible for her neuropathy.  Lyrica is a controlled substance and therefore can cause some bad side effects.

## 2020-01-30 NOTE — Telephone Encounter (Signed)
Pt left v/m returning call and request cb. 

## 2020-01-30 NOTE — Telephone Encounter (Signed)
Left message to return call to our office.  

## 2020-02-02 NOTE — Telephone Encounter (Signed)
Patient came in office. States that she was not able to tolerate gabapentin in the past. She will start back on Lyrica 50mg  bid.  She will call office next week with update.

## 2020-02-02 NOTE — Telephone Encounter (Signed)
Patient came by the office to speak to [facility].  Patient said she can't get through on the phone.  Patient said she'll come back after 1:00 to speak to [facility].  Patient said not to call her on her phone.

## 2020-02-02 NOTE — Addendum Note (Signed)
Addended by: Pleas Koch on: 02/02/2020 08:01 PM   Modules accepted: Orders

## 2020-02-02 NOTE — Telephone Encounter (Signed)
Noted  

## 2020-02-03 ENCOUNTER — Other Ambulatory Visit: Payer: Medicare Other

## 2020-02-03 ENCOUNTER — Other Ambulatory Visit: Payer: Self-pay

## 2020-02-03 ENCOUNTER — Ambulatory Visit (INDEPENDENT_AMBULATORY_CARE_PROVIDER_SITE_OTHER): Payer: Medicare Other

## 2020-02-03 DIAGNOSIS — Z0000 Encounter for general adult medical examination without abnormal findings: Secondary | ICD-10-CM

## 2020-02-03 NOTE — Patient Instructions (Signed)
[name] , Thank you for taking time to come for your Medicare Wellness Visit. I appreciate your ongoing commitment to your health goals. Please review the following plan we discussed and let me know if I can assist you in the future.   Screening recommendations/referrals: Colonoscopy: no longer required Mammogram: Up to date, completed 01/13/2020, due 11/2020 Bone Density: Up to date, completed 07/01/2018, due 06/2020 Recommended yearly ophthalmology/optometry visit for glaucoma screening and checkup Recommended yearly dental visit for hygiene and checkup  Vaccinations: Influenza vaccine: Up to date, completed 01/27/2020, due 11/2020 Pneumococcal vaccine: Completed series Tdap vaccine: Up to date, completed 01/10/2012, due 12/2021 Shingles vaccine: due, check with your insurance regarding coverage   Covid-19:Completed series  Advanced directives: Please bring a copy of your POA (Power of Attorney) and/or Living Will to your next appointment.  Conditions/risks identified: hypertension, hyperlipidemia  Next appointment: Follow up in one year for your annual wellness visit    Preventive Care 29 Years and Older, Female Preventive care refers to lifestyle choices and visits with your health care provider that can promote health and wellness. What does preventive care include?  A yearly physical exam. This is also called an annual well check.  Dental exams once or twice a year.  Routine eye exams. Ask your health care provider how often you should have your eyes checked.  Personal lifestyle choices, including:  Daily care of your teeth and gums.  Regular physical activity.  Eating a healthy diet.  Avoiding tobacco and drug use.  Limiting alcohol use.  Practicing safe sex.  Taking low-dose aspirin every day.  Taking vitamin and mineral supplements as recommended by your health care provider. What happens during an annual well check? The services and screenings done by your  health care provider during your annual well check will depend on your age, overall health, lifestyle risk factors, and family history of disease. Counseling  Your health care provider may ask you questions about your:  Alcohol use.  Tobacco use.  Drug use.  Emotional well-being.  Home and relationship well-being.  Sexual activity.  Eating habits.  History of falls.  Memory and ability to understand (cognition).  Work and work Statistician.  Reproductive health. Screening  You may have the following tests or measurements:  Height, weight, and BMI.  Blood pressure.  Lipid and cholesterol levels. These may be checked every 5 years, or more frequently if you are over 26 years old.  Skin check.  Lung cancer screening. You may have this screening every year starting at age 55 if you have a 30-pack-year history of smoking and currently smoke or have quit within the past 15 years.  Fecal occult blood test (FOBT) of the stool. You may have this test every year starting at age 39.  Flexible sigmoidoscopy or colonoscopy. You may have a sigmoidoscopy every 5 years or a colonoscopy every 10 years starting at age 72.  Hepatitis C blood test.  Hepatitis B blood test.  Sexually transmitted disease (STD) testing.  Diabetes screening. This is done by checking your blood sugar (glucose) after you have not eaten for a while (fasting). You may have this done every 1-3 years.  Bone density scan. This is done to screen for osteoporosis. You may have this done starting at age 61.  Mammogram. This may be done every 1-2 years. Talk to your health care provider about how often you should have regular mammograms. Talk with your health care provider about your test results, treatment options, and  if necessary, the need for more tests. Vaccines  Your health care provider may recommend certain vaccines, such as:  Influenza vaccine. This is recommended every year.  Tetanus, diphtheria, and  acellular pertussis (Tdap, Td) vaccine. You may need a Td booster every 10 years.  Zoster vaccine. You may need this after age 75.  Pneumococcal 13-valent conjugate (PCV13) vaccine. One dose is recommended after age 49.  Pneumococcal polysaccharide (PPSV23) vaccine. One dose is recommended after age 30. Talk to your health care provider about which screenings and vaccines you need and how often you need them. This information is not intended to replace advice given to you by your health care provider. Make sure you discuss any questions you have with your health care provider. Document Released: 05/14/2015 Document Revised: 01/05/2016 Document Reviewed: 02/16/2015 Elsevier Interactive Patient Education  2017 [place] Prevention in the Home Falls can cause injuries. They can happen to people of all ages. There are many things you can do to make your home safe and to help prevent falls. What can I do on the outside of my home?  Regularly fix the edges of walkways and driveways and fix any cracks.  Remove anything that might make you trip as you walk through a door, such as a raised step or threshold.  Trim any bushes or trees on the path to your home.  Use bright outdoor lighting.  Clear any walking paths of anything that might make someone trip, such as rocks or tools.  Regularly check to see if handrails are loose or broken. Make sure that both sides of any steps have handrails.  Any raised decks and porches should have guardrails on the edges.  Have any leaves, snow, or ice cleared regularly.  Use sand or salt on walking paths during winter.  Clean up any spills in your garage right away. This includes oil or grease spills. What can I do in the bathroom?  Use night lights.  Install grab bars by the toilet and in the tub and shower. Do not use towel bars as grab bars.  Use non-skid mats or decals in the tub or shower.  If you need to sit down in the shower, use  a plastic, non-slip stool.  Keep the floor dry. Clean up any water that spills on the floor as soon as it happens.  Remove soap buildup in the tub or shower regularly.  Attach bath mats securely with double-sided non-slip rug tape.  Do not have throw rugs and other things on the floor that can make you trip. What can I do in the bedroom?  Use night lights.  Make sure that you have a light by your bed that is easy to reach.  Do not use any sheets or blankets that are too big for your bed. They should not hang down onto the floor.  Have a firm chair that has side arms. You can use this for support while you get dressed.  Do not have throw rugs and other things on the floor that can make you trip. What can I do in the kitchen?  Clean up any spills right away.  Avoid walking on wet floors.  Keep items that you use a lot in easy-to-reach places.  If you need to reach something above you, use a strong step stool that has a grab bar.  Keep electrical cords out of the way.  Do not use floor polish or wax that makes floors slippery. If you  must use wax, use non-skid floor wax.  Do not have throw rugs and other things on the floor that can make you trip. What can I do with my stairs?  Do not leave any items on the stairs.  Make sure that there are handrails on both sides of the stairs and use them. Fix handrails that are broken or loose. Make sure that handrails are as long as the stairways.  Check any carpeting to make sure that it is firmly attached to the stairs. Fix any carpet that is loose or worn.  Avoid having throw rugs at the top or bottom of the stairs. If you do have throw rugs, attach them to the floor with carpet tape.  Make sure that you have a light switch at the top of the stairs and the bottom of the stairs. If you do not have them, ask someone to add them for you. What else can I do to help prevent falls?  Wear shoes that:  Do not have high heels.  Have  rubber bottoms.  Are comfortable and fit you well.  Are closed at the toe. Do not wear sandals.  If you use a stepladder:  Make sure that it is fully opened. Do not climb a closed stepladder.  Make sure that both sides of the stepladder are locked into place.  Ask someone to hold it for you, if possible.  Clearly mark and make sure that you can see:  Any grab bars or handrails.  First and last steps.  Where the edge of each step is.  Use tools that help you move around (mobility aids) if they are needed. These include:  Canes.  Walkers.  Scooters.  Crutches.  Turn on the lights when you go into a dark area. Replace any light bulbs as soon as they burn out.  Set up your furniture so you have a clear path. Avoid moving your furniture around.  If any of your floors are uneven, fix them.  If there are any pets around you, be aware of where they are.  Review your medicines with your doctor. Some medicines can make you feel dizzy. This can increase your chance of falling. Ask your doctor what other things that you can do to help prevent falls. This information is not intended to replace advice given to you by your health care provider. Make sure you discuss any questions you have with your health care provider. Document Released: 02/11/2009 Document Revised: 09/23/2015 Document Reviewed: 05/22/2014 Elsevier Interactive Patient Education  2017 [employer].

## 2020-02-03 NOTE — Progress Notes (Signed)
PCP notes:  Health Maintenance: No gaps noted   Abnormal Screenings: none   Patient concerns: none   Nurse concerns: none   Next PCP appt.: 03/16/2020 @ 2:20 pm

## 2020-02-03 NOTE — Progress Notes (Addendum)
Subjective:   Michelle Ball is a 86 y.o. female who presents for Medicare Annual (Subsequent) preventive examination.  Review of Systems: N/A     I connected with the patient today by telephone and verified that I am speaking with the correct person using two identifiers. Location patient: home Location nurse: work Persons participating in the telephone visit: patient, nurse.   I discussed the limitations, risks, security and privacy concerns of performing an evaluation and management service by telephone and the availability of in person appointments. I also discussed with the patient that there may be a patient responsible charge related to this service. The patient expressed understanding and verbally consented to this telephonic visit.        Cardiac Risk Factors include: advanced age (>36men, >60 women);hypertension;Other (see comment), Risk factor comments: hyperlipidemia     Objective:    Today's Vitals   02/03/20 1113  PainSc: 3    There is no height or weight on file to calculate BMI.  Advanced Directives 02/03/2020 01/27/2019 01/22/2018  Does Patient Have a Medical Advance Directive? Yes No;Yes Yes  Type of Paramedic of [place];Living will [place];Living will [place];Living will  Copy of [place] in Chart? No - copy requested No - copy requested No - copy requested    Current Medications (verified) Outpatient Encounter Medications as of 02/03/2020  Medication Sig  . alendronate (FOSAMAX) 70 MG tablet TAKE 1 TABLET BY MOUTH ONCE WEEKLY WITH A FULL GLASS OF WATER ON AN EMPTY STOMACH. AVOID LAYING FLAT FOR 1 HOUR  . atorvastatin (LIPITOR) 20 MG tablet TAKE 1 TABLET(20 MG) BY MOUTH DAILY FOR CHOLESTEROL  . Calcium Carbonate-Vitamin D (CALCIUM-D PO) Take by mouth daily.  Marland Kitchen levothyroxine (SYNTHROID) 88 MCG tablet TAKE 1 TABLET BY MOUTH EVERY MORNING ON AN EMPTY STOMACH, NO FOOD OR  OTHER MEDICATION FOR 30 MINUTES  . losartan (COZAAR) 100 MG tablet TAKE 1 TABLET BY MOUTH EVERY DAY FOR BLOOD PRESSURE  . meloxicam (MOBIC) 15 MG tablet TAKE 1 TABLET(15 MG) BY MOUTH DAILY AS NEEDED FOR PAIN (Patient taking differently: Take 7.5 mg by mouth daily as needed. )  . pregabalin (LYRICA) 50 MG capsule Take 50 mg by mouth in the morning and at bedtime.  Marland Kitchen tiotropium (SPIRIVA HANDIHALER) 18 MCG inhalation capsule Place 1 capsule (18 mcg total) into inhaler and inhale daily.  Marland Kitchen VYZULTA 0.024 % SOLN INT 1 GTT IN OU QHS   No facility-administered encounter medications on file as of 02/03/2020.    Allergies (verified) Patient has no known allergies.   History: Past Medical History:  Diagnosis Date  . Arthritis   . COPD (chronic obstructive pulmonary disease) ([place])   . Environmental and seasonal allergies   . Hyperlipidemia   . Hypertension   . Neuropathy   . Thyroid disease    Past Surgical History:  Procedure Laterality Date  . ABDOMINAL HYSTERECTOMY    . APPENDECTOMY    . CHOLECYSTECTOMY    . REPLACEMENT TOTAL KNEE Right 2016  . TONSILLECTOMY AND ADENOIDECTOMY     Family History  Problem Relation Age of Onset  . Arthritis Mother   . Cancer Mother   . Arthritis Father   . Early death Father   . Heart disease Father   . Heart attack Father   . Hyperlipidemia Father   . Hypertension Father   . Arthritis Sister   . Depression Sister   . Hypertension Sister   .  Hyperlipidemia Sister   . Breast cancer Neg Hx    Social History   Socioeconomic History  . Marital status: Married    Spouse name: Not on file  . Number of children: Not on file  . Years of education: Not on file  . Highest education level: Not on file  Occupational History  . Not on file  Tobacco Use  . Smoking status: Former Research scientist (life sciences)  . Smokeless tobacco: Never Used  Vaping Use  . Vaping Use: Never used  Substance and Sexual Activity  . Alcohol use: Never  . Drug use: Never  . Sexual  activity: Not Currently  Other Topics Concern  . Not on file  Social History Narrative  . Not on file   Social Determinants of Health   Financial Resource Strain: Low Risk   . Difficulty of Paying Living Expenses: Not hard at all  Food Insecurity: No Food Insecurity  . Worried About Charity fundraiser in the Last Year: Never true  . Ran Out of Food in the Last Year: Never true  Transportation Needs: No Transportation Needs  . Lack of Transportation (Medical): No  . Lack of Transportation (Non-Medical): No  Physical Activity: Sufficiently Active  . Days of Exercise per Week: 5 days  . Minutes of Exercise per Session: 50 min  Stress: No Stress Concern Present  . Feeling of Stress : Not at all  Social Connections:   . Frequency of Communication with Friends and Family: Not on file  . Frequency of Social Gatherings with Friends and Family: Not on file  . Attends Religious Services: Not on file  . Active Member of Clubs or Organizations: Not on file  . Attends Archivist Meetings: Not on file  . Marital Status: Not on file    Tobacco Counseling Counseling given: Not Answered   Clinical Intake:  Pre-visit preparation completed: Yes  Pain : 0-10 Pain Score: 3  Pain Type: Chronic pain Pain Location: Leg (neuropathy) Pain Orientation: Left, Right Pain Descriptors / Indicators: Burning, Tingling Pain Onset: More than a month ago Pain Frequency: Constant     Nutritional Risks: None Diabetes: No  How often do you need to have someone help you when you read instructions, pamphlets, or other written materials from your doctor or pharmacy?: 1 - Never What is the last grade level you completed in school?: 2 master degree  Diabetic: No Nutrition Risk Assessment:  Has the patient had any N/V/D within the last 2 months?  No  Does the patient have any non-healing wounds?  No  Has the patient had any unintentional weight loss or weight gain?  No   Diabetes:  Is  the patient diabetic?  No  If diabetic, was a CBG obtained today?  N/A Did the patient bring in their glucometer from home?  N/A How often do you monitor your CBG's? N/A.   Financial Strains and Diabetes Management:  Are you having any financial strains with the device, your supplies or your medication? N/A.  Does the patient want to be seen by Chronic Care Management for management of their diabetes?  N/A Would the patient like to be referred to a Nutritionist or for Diabetic Management?  N/A     Interpreter Needed?: No  Information entered by :: CJohnson, LPN   Activities of Daily Living In your present state of health, do you have any difficulty performing the following activities: 02/03/2020  Hearing? N  Vision? N  Difficulty concentrating or making  decisions? N  Walking or climbing stairs? N  Dressing or bathing? N  Doing errands, shopping? N  Preparing Food and eating ? N  Using the Toilet? N  In the past six months, have you accidently leaked urine? Y  Comment wears pad when going out  Do you have problems with loss of bowel control? N  Managing your Medications? N  Managing your Finances? N  Housekeeping or managing your Housekeeping? N  Some recent data might be hidden    Patient Care Team: Pleas Koch, NP as PCP - General (Internal Medicine)  Indicate any recent Medical Services you may have received from other than [facility] providers in the past year (date may be approximate).     Assessment:   This is a routine wellness examination for Jalyne.  Hearing/Vision screen  Hearing Screening   125Hz  250Hz  500Hz  1000Hz  2000Hz  3000Hz  4000Hz  6000Hz  8000Hz   Right ear:           Left ear:           Vision Screening Comments: Patient gets annual eye exams   Dietary issues and exercise activities discussed: Current Exercise Habits: Home exercise routine, Type of exercise: Other - see comments (water aerobics), Time (Minutes): 45, Frequency (Times/Week): 5,  Weekly Exercise (Minutes/Week): 225, Intensity: Moderate, Exercise limited by: None identified  Goals    . Patient Stated     Starting 01/22/2018, I will continue to take medications as prescribed.     . Patient Stated     01/27/2019, Patient wants to increase her exercise in the future    . Patient Stated     02/03/2020, I will continue water aerobics 3-5 days a week for 45 minutes.       Depression Screen PHQ 2/9 Scores 02/03/2020 01/27/2019 01/22/2018  PHQ - 2 Score 0 0 0  PHQ- 9 Score 0 0 0    Fall Risk Fall Risk  02/03/2020 01/27/2019 01/22/2018 11/22/2017  Falls in the past year? 0 0 No No  Number falls in past yr: 0 0 - -  Injury with Fall? 0 - - -  Risk for fall due to : Medication side effect Medication side effect;Impaired mobility - -  Follow up Falls evaluation completed;Falls prevention discussed Falls evaluation completed;Falls prevention discussed - -    Any stairs in or around the home? Yes  If so, are there any without handrails? No  Home free of loose throw rugs in walkways, pet beds, electrical cords, etc? Yes  Adequate lighting in your home to reduce risk of falls? Yes   ASSISTIVE DEVICES UTILIZED TO PREVENT FALLS:  Life alert? No  Use of a cane, walker or w/c? Yes , cane and walker Grab bars in the bathroom? Yes  Shower chair or bench in shower? Yes  Elevated toilet seat or a handicapped toilet? Yes   TIMED UP AND GO:  Was the test performed? N/A, telephonic visit .    Cognitive Function: MMSE - Mini Mental State Exam 02/03/2020 01/27/2019 01/22/2018  Orientation to time 5 5 5   Orientation to Place 5 5 5   Registration 3 3 3   Attention/ Calculation 5 5 0  Recall 3 3 3   Language- name 2 objects - - 0  Language- repeat 1 1 1   Language- follow 3 step command - - 3  Language- read & follow direction - - 0  Write a sentence - - 0  Copy design - - 0  Total score - - 20  Mini Cog  Mini-Cog screen was completed. Maximum score is 22. A value of 0 denotes  this part of the MMSE was not completed or the patient failed this part of the Mini-Cog screening.       Immunizations Immunization History  Administered Date(s) Administered  . Influenza, High Dose Seasonal PF 01/07/2019, 01/27/2020  . Influenza,inj,Quad PF,6+ Mos 01/22/2018  . Influenza-Unspecified 01/24/2017  . PFIZER SARS-COV-2 Vaccination 05/26/2019, 06/16/2019  . Pneumococcal Conjugate-13 07/29/2013  . Pneumococcal Polysaccharide-23 03/31/2015  . Pneumococcal-Unspecified 05/01/2013  . Td 01/10/2012  . Zoster 05/03/2009    TDAP status: Up to date Flu Vaccine status: Up to date Pneumococcal vaccine status: Up to date Covid-19 vaccine status: Completed vaccines  Qualifies for Shingles Vaccine? Yes   Zostavax completed Yes   Shingrix Completed?: No.    Education has been provided regarding the importance of this vaccine. Patient has been advised to call insurance company to determine out of pocket expense if they have not yet received this vaccine. Advised may also receive vaccine at local pharmacy or Health Dept. Verbalized acceptance and understanding.   Screening Tests Health Maintenance  Topic Date Due  . TETANUS/TDAP  01/09/2022  . INFLUENZA VACCINE  Completed  . DEXA SCAN  Completed  . COVID-19 Vaccine  Completed  . PNA vac Low Risk Adult  Completed    Health Maintenance  There are no preventive care reminders to display for this patient.  Colorectal cancer screening: No longer required.  Mammogram status: Completed 01/13/2020. Repeat every year Bone Density status: Completed 07/01/2018. Results reflect: Bone density results: OSTEOPOROSIS. Repeat every 2 years.  Lung Cancer Screening: (Low Dose CT Chest recommended if Age 25-80 years, 30 pack-year currently smoking OR have quit w/in 15years.) does not qualify.    Additional Screening:  Hepatitis C Screening: does not qualify; Completed N/A  Vision Screening: Recommended annual ophthalmology exams for early  detection of glaucoma and other disorders of the eye. Is the patient up to date with their annual eye exam?  Yes  Who is the provider or what is the name of the office in which the patient attends annual eye exams? [facility] If pt is not established with a provider, would they like to be referred to a provider to establish care? No .   Dental Screening: Recommended annual dental exams for proper oral hygiene  Community Resource Referral / Chronic Care Management: CRR required this visit?  No   CCM required this visit?  No      Plan:     I have personally reviewed and noted the following in the patient's chart:   . Medical and social history . Use of alcohol, tobacco or illicit drugs  . Current medications and supplements . Functional ability and status . Nutritional status . Physical activity . Advanced directives . List of other physicians . Hospitalizations, surgeries, and ER visits in previous 12 months . Vitals . Screenings to include cognitive, depression, and falls . Referrals and appointments  In addition, I have reviewed and discussed with patient certain preventive protocols, quality metrics, and best practice recommendations. A written personalized care plan for preventive services as well as general preventive health recommendations were provided to patient.   Due to this being a telephonic visit, the after visit summary with patients personalized plan was offered to patient via office or my-chart. Patient preferred to pick up at office at next visit or via mychart.   Andrez Grime, LPN   [id number]

## 2020-02-06 ENCOUNTER — Encounter: Payer: Medicare Other | Admitting: Primary Care

## 2020-02-09 ENCOUNTER — Encounter: Payer: Medicare Other | Admitting: Primary Care

## 2020-02-10 NOTE — Telephone Encounter (Signed)
Patient called back she is taking 50 mg bid. She is getting up every hour in the night. Wanted to know if any of the following were good options to help with it?  Should she increase to 1 in am and two at night When was taken off lyrica she was taking two tylenol at night and it helped little bit. She is currently only taking one at night should she try to go back to two at night If you don't want to increase medications should she start a sleeping pill? She has never had in the past.  In the past she has Melaxicam 15mg  at night but has been cut back to 1/2 tab should she take a whole one?

## 2020-02-10 NOTE — Telephone Encounter (Signed)
Left message to return call to our office.  

## 2020-02-10 NOTE — Telephone Encounter (Signed)
Please thank patient for the update. Have her take a whole meloxicam tablet at bedtime. Okay to take melatonin 5 to 10 mg at bedtime for sleep.  Continue Lyrica 2 times daily as prescribed. Have her update [place] in a few days.

## 2020-02-10 NOTE — Telephone Encounter (Signed)
Patient called in stating she is needing to speak with Joellen in regards to medications. Please advise.

## 2020-02-11 NOTE — Telephone Encounter (Signed)
Called patient reviewed information. Will call in few days and let us know how she is doing. She also repeated back to me twice all recommendations and wrote down as well.

## 2020-02-12 ENCOUNTER — Other Ambulatory Visit: Payer: Self-pay | Admitting: Primary Care

## 2020-02-12 DIAGNOSIS — G629 Polyneuropathy, unspecified: Secondary | ICD-10-CM

## 2020-02-13 ENCOUNTER — Other Ambulatory Visit: Payer: Self-pay | Admitting: Primary Care

## 2020-02-13 DIAGNOSIS — G629 Polyneuropathy, unspecified: Secondary | ICD-10-CM

## 2020-02-13 MED ORDER — PREGABALIN 50 MG PO CAPS: 50.0000 mg | ORAL_CAPSULE | Freq: Two times a day (BID) | ORAL | 0 refills | Status: DC

## 2020-02-13 NOTE — Telephone Encounter (Signed)
Prescription printed and placed in your inbox.

## 2020-02-13 NOTE — Telephone Encounter (Signed)
Patient is currently taking one tab bid ok to refill?

## 2020-03-14 ENCOUNTER — Other Ambulatory Visit: Payer: Self-pay | Admitting: Primary Care

## 2020-03-14 DIAGNOSIS — M8949 Other hypertrophic osteoarthropathy, multiple sites: Secondary | ICD-10-CM

## 2020-03-14 DIAGNOSIS — I10 Essential (primary) hypertension: Secondary | ICD-10-CM

## 2020-03-14 DIAGNOSIS — E785 Hyperlipidemia, unspecified: Secondary | ICD-10-CM

## 2020-03-14 DIAGNOSIS — M159 Polyosteoarthritis, unspecified: Secondary | ICD-10-CM

## 2020-03-14 DIAGNOSIS — E039 Hypothyroidism, unspecified: Secondary | ICD-10-CM

## 2020-03-16 ENCOUNTER — Encounter: Payer: Self-pay | Admitting: Primary Care

## 2020-03-16 ENCOUNTER — Ambulatory Visit (INDEPENDENT_AMBULATORY_CARE_PROVIDER_SITE_OTHER): Payer: Medicare Other | Admitting: Primary Care

## 2020-03-16 ENCOUNTER — Other Ambulatory Visit: Payer: Self-pay

## 2020-03-16 VITALS — BP 122/60 | HR 76 | Temp 98.0°F | Ht 63.5 in | Wt 147.0 lb

## 2020-03-16 DIAGNOSIS — J449 Chronic obstructive pulmonary disease, unspecified: Secondary | ICD-10-CM | POA: Diagnosis not present

## 2020-03-16 DIAGNOSIS — M159 Polyosteoarthritis, unspecified: Secondary | ICD-10-CM

## 2020-03-16 DIAGNOSIS — R2681 Unsteadiness on feet: Secondary | ICD-10-CM

## 2020-03-16 DIAGNOSIS — E039 Hypothyroidism, unspecified: Secondary | ICD-10-CM

## 2020-03-16 DIAGNOSIS — E785 Hyperlipidemia, unspecified: Secondary | ICD-10-CM

## 2020-03-16 DIAGNOSIS — Z0000 Encounter for general adult medical examination without abnormal findings: Secondary | ICD-10-CM | POA: Diagnosis not present

## 2020-03-16 DIAGNOSIS — K219 Gastro-esophageal reflux disease without esophagitis: Secondary | ICD-10-CM

## 2020-03-16 DIAGNOSIS — H6123 Impacted cerumen, bilateral: Secondary | ICD-10-CM

## 2020-03-16 DIAGNOSIS — L918 Other hypertrophic disorders of the skin: Secondary | ICD-10-CM | POA: Diagnosis not present

## 2020-03-16 DIAGNOSIS — I10 Essential (primary) hypertension: Secondary | ICD-10-CM

## 2020-03-16 DIAGNOSIS — I700 Atherosclerosis of aorta: Secondary | ICD-10-CM

## 2020-03-16 DIAGNOSIS — G629 Polyneuropathy, unspecified: Secondary | ICD-10-CM

## 2020-03-16 DIAGNOSIS — M8949 Other hypertrophic osteoarthropathy, multiple sites: Secondary | ICD-10-CM

## 2020-03-16 DIAGNOSIS — M810 Age-related osteoporosis without current pathological fracture: Secondary | ICD-10-CM

## 2020-03-16 MED ORDER — PREGABALIN 50 MG PO CAPS: ORAL_CAPSULE | ORAL | 0 refills | Status: DC

## 2020-03-16 NOTE — Assessment & Plan Note (Addendum)
LDL remains at goal. Continue Lipitor 20mg  once daily.   Agree with assessment and plan. Pleas Koch, NP

## 2020-03-16 NOTE — Patient Instructions (Addendum)
You will be contacted regarding your referral to dermatology. Please let us know if you have not been contacted within two weeks.   Ensure you are consuming 64 ounces of water daily.  You may take 158m of Lyrica at bedtime.   It was a pleasure to see you today!  Preventive Care 674Years and Older, Female Preventive care refers to lifestyle choices and visits with your health care provider that can promote health and wellness. This includes:  A yearly physical exam. This is also called an annual well check.  Regular dental and eye exams.  Immunizations.  Screening for certain conditions.  Healthy lifestyle choices, such as diet and exercise. What can I expect for my preventive care visit? Physical exam Your health care provider will check:  Height and weight. These may be used to calculate body mass index (BMI), which is a measurement that tells if you are at a healthy weight.  Heart rate and blood pressure.  Your skin for abnormal spots. Counseling Your health care provider may ask you questions about:  Alcohol, tobacco, and drug use.  Emotional well-being.  Home and relationship well-being.  Sexual activity.  Eating habits.  History of falls.  Memory and ability to understand (cognition).  Work and work eStatistician  Pregnancy and menstrual history. What immunizations do I need?  Influenza (flu) vaccine  This is recommended every year. Tetanus, diphtheria, and pertussis (Tdap) vaccine  You may need a Td booster every 10 years. Varicella (chickenpox) vaccine  You may need this vaccine if you have not already been vaccinated. Zoster (shingles) vaccine  You may need this after age 84 Pneumococcal conjugate (PCV13) vaccine  One dose is recommended after age 84 Pneumococcal polysaccharide (PPSV23) vaccine  One dose is recommended after age 84 Measles, mumps, and rubella (MMR) vaccine  You may need at least one dose of MMR if you were born in 1957  or later. You may also need a second dose. Meningococcal conjugate (MenACWY) vaccine  You may need this if you have certain conditions. Hepatitis A vaccine  You may need this if you have certain conditions or if you travel or work in places where you may be exposed to hepatitis A. Hepatitis B vaccine  You may need this if you have certain conditions or if you travel or work in places where you may be exposed to hepatitis B. Haemophilus influenzae type b (Hib) vaccine  You may need this if you have certain conditions. You may receive vaccines as individual doses or as more than one vaccine together in one shot (combination vaccines). Talk with your health care provider about the risks and benefits of combination vaccines. What tests do I need? Blood tests  Lipid and cholesterol levels. These may be checked every 5 years, or more frequently depending on your overall health.  Hepatitis C test.  Hepatitis B test. Screening  Lung cancer screening. You may have this screening every year starting at age 56044if you have a 30-pack-year history of smoking and currently smoke or have quit within the past 15 years.  Colorectal cancer screening. All adults should have this screening starting at age 56078and continuing until age 84 Your health care provider may recommend screening at age 6072if you are at increased risk. You will have tests every 1-10 years, depending on your results and the type of screening test.  Diabetes screening. This is done by checking your blood sugar (glucose) after you have not eaten for a while (  fasting). You may have this done every 1-3 years.  Mammogram. This may be done every 1-2 years. Talk with your health care provider about how often you should have regular mammograms.  BRCA-related cancer screening. This may be done if you have a family history of breast, ovarian, tubal, or peritoneal cancers. Other tests  Sexually transmitted disease (STD) testing.  Bone  density scan. This is done to screen for osteoporosis. You may have this done starting at age 21. Follow these instructions at home: Eating and drinking  Eat a diet that includes fresh fruits and vegetables, whole grains, lean protein, and low-fat dairy products. Limit your intake of foods with high amounts of sugar, saturated fats, and salt.  Take vitamin and mineral supplements as recommended by your health care provider.  Do not drink alcohol if your health care provider tells you not to drink.  If you drink alcohol: ? Limit how much you have to 0-1 drink a day. ? Be aware of how much alcohol is in your drink. In the [place] one drink equals one 12 oz bottle of beer (355 mL), one 5 oz glass of wine (148 mL), or one 1 oz glass of hard liquor (44 mL). Lifestyle  Take daily care of your teeth and gums.  Stay active. Exercise for at least 30 minutes on 5 or more days each week.  Do not use any products that contain nicotine or tobacco, such as cigarettes, e-cigarettes, and chewing tobacco. If you need help quitting, ask your health care provider.  If you are sexually active, practice safe sex. Use a condom or other form of protection in order to prevent STIs (sexually transmitted infections).  Talk with your health care provider about taking a low-dose aspirin or statin. What's next?  Go to your health care provider once a year for a well check visit.  Ask your health care provider how often you should have your eyes and teeth checked.  Stay up to date on all vaccines. This information is not intended to replace advice given to you by your health care provider. Make sure you discuss any questions you have with your health care provider. Document Revised: 04/11/2018 Document Reviewed: 04/11/2018 Elsevier Patient Education  2020 [employer].

## 2020-03-16 NOTE — Assessment & Plan Note (Signed)
Doing well on meloxicam as needed. Commended her on daily activity and exercise.

## 2020-03-16 NOTE — Progress Notes (Signed)
Patient comes in today for a to have wax removed from both ears. Both ears were flushed with warm water mixed with Docusate Sodium. Particles of  wax returned with the irrigation solution from both ears and ear drums are visible bilaterally on inspection. Patient tolerated this procedure well and denies any ear pain post procedure.

## 2020-03-16 NOTE — Assessment & Plan Note (Addendum)
Immunizations: UTD Bone density: UTD Mammogram: UTD, 2021  Commended on exercise of aerobic 5 days per week. Continue to work on [employer].     Exam today unremarkable. Labs reviewed. Follow up in one year for CPE.   Agree with assessment and plan. Pleas Koch, NP

## 2020-03-16 NOTE — Assessment & Plan Note (Addendum)
Exertional dyspnea and throat clearing has improved with use of daily Spiriva & Symbicort. She remains very active with exercise and daily activities.  Continue this regimen.  Agree with assessment and plan. Pleas Koch, NP

## 2020-03-16 NOTE — Assessment & Plan Note (Signed)
Infrequent symptoms. Continue to monitor.  

## 2020-03-16 NOTE — Assessment & Plan Note (Addendum)
TSH well controlled on levothyroxine 88 mcg.   She is taking this correctly, continue this regimen.   Agree with assessment and plan. Pleas Koch, NP

## 2020-03-16 NOTE — Assessment & Plan Note (Addendum)
Bone Density completed in March 2020.  She is currently fosamax, calcium & vitamin D, she is complaint to this regimen.   Repeat bone density in 2022.   Agree with assessment and plan. Pleas Koch, NP

## 2020-03-16 NOTE — Assessment & Plan Note (Signed)
Significant improvement after physical therapy.  Also suspect her prior higher doses of Lyrica to be contributing. Continue lower dose of Lyrica during the day, okay to increase at night.

## 2020-03-16 NOTE — Assessment & Plan Note (Addendum)
Since deceasing Lyrica dose to 50mg  BID she has noticed an increase in neuropathic pain/ burning worse at night effecting sleep.   Agree to increase Lyrica dose to 100mg  at bedtime, continue 50mg  once during the day. Her imbalance has improved after physical therapy.  Agree with assessment and plan. Pleas Koch, NP

## 2020-03-16 NOTE — Progress Notes (Signed)
Subjective:    Patient ID: Michelle Ball, female    DOB: [birth year], 86 y.o.   MRN: [id number]  HPI  This visit occurred during the SARS-CoV-2 public health emergency.  Safety protocols were in place, including screening questions prior to the visit, additional usage of staff PPE, and extensive cleaning of exam room while observing appropriate contact time as indicated for disinfecting solutions.   Michelle Ball is an 86 year old female who presents today for complete physical.  She would also like a referral to dermatology for an annual skin evaluation. She was once seeing dermatology annually, has not been in years and has several older spots she would like evaluated. History of Mohs procedure.  Immunizations: -Tetanus: Completed in 2013 -Influenza: Completed this season  -Shingles: Completed Zostavax in 2011 -Pneumonia: Completed Prevnar 13 in 2015, Pneumovax in 2016. -Covid-19: Completed series  Diet: She endorses a healthy diet. Exercise: She is exercising regularly with water aerobics and walking.  Eye exam: Completes annually. Dental exam: Complete semiannually.  Mammogram: Completed in 2021 Dexa: Completed in 2020, osteoporosis Colonoscopy: N/A given age  BP Readings from Last 3 Encounters:  03/16/20 122/60  01/12/20 130/76  01/06/20 138/74      Review of Systems  Constitutional: Negative for unexpected weight change.  HENT: Negative for rhinorrhea.   Eyes: Negative for visual disturbance.  Respiratory: Positive for cough. Negative for shortness of breath.        Chronic cough, improved  Cardiovascular: Negative for chest pain.  Gastrointestinal: Negative for constipation and diarrhea.  Genitourinary: Negative for difficulty urinating and menstrual problem.  Musculoskeletal: Positive for arthralgias. Negative for myalgias.       Lower extremity pain  Skin: Negative for rash.  Allergic/Immunologic: Negative for environmental allergies.  Neurological:  Positive for numbness. Negative for dizziness and headaches.       Chronic neuropathy to lower extremities  Psychiatric/Behavioral: The patient is not nervous/anxious.        Past Medical History:  Diagnosis Date  . Arthritis   . COPD (chronic obstructive pulmonary disease) ([place])   . Environmental and seasonal allergies   . Hyperlipidemia   . Hypertension   . Neuropathy   . Thyroid disease      Social History   Socioeconomic History  . Marital status: Married    Spouse name: Not on file  . Number of children: Not on file  . Years of education: Not on file  . Highest education level: Not on file  Occupational History  . Not on file  Tobacco Use  . Smoking status: Former Research scientist (life sciences)  . Smokeless tobacco: Never Used  Vaping Use  . Vaping Use: Never used  Substance and Sexual Activity  . Alcohol use: Never  . Drug use: Never  . Sexual activity: Not Currently  Other Topics Concern  . Not on file  Social History Narrative  . Not on file   Social Determinants of Health   Financial Resource Strain: Low Risk   . Difficulty of Paying Living Expenses: Not hard at all  Food Insecurity: No Food Insecurity  . Worried About Charity fundraiser in the Last Year: Never true  . Ran Out of Food in the Last Year: Never true  Transportation Needs: No Transportation Needs  . Lack of Transportation (Medical): No  . Lack of Transportation (Non-Medical): No  Physical Activity: Sufficiently Active  . Days of Exercise per Week: 5 days  . Minutes of Exercise per Session: 50 min  Stress: No Stress Concern Present  . Feeling of Stress : Not at all  Social Connections:   . Frequency of Communication with Friends and Family: Not on file  . Frequency of Social Gatherings with Friends and Family: Not on file  . Attends Religious Services: Not on file  . Active Member of Clubs or Organizations: Not on file  . Attends Archivist Meetings: Not on file  . Marital Status: Not on file    Intimate Partner Violence: Not At Risk  . Fear of Current or Ex-Partner: No  . Emotionally Abused: No  . Physically Abused: No  . Sexually Abused: No    Past Surgical History:  Procedure Laterality Date  . ABDOMINAL HYSTERECTOMY    . APPENDECTOMY    . CHOLECYSTECTOMY    . REPLACEMENT TOTAL KNEE Right 2016  . TONSILLECTOMY AND ADENOIDECTOMY      Family History  Problem Relation Age of Onset  . Arthritis Mother   . Cancer Mother   . Arthritis Father   . Early death Father   . Heart disease Father   . Heart attack Father   . Hyperlipidemia Father   . Hypertension Father   . Arthritis Sister   . Depression Sister   . Hypertension Sister   . Hyperlipidemia Sister   . Breast cancer Neg Hx     No Known Allergies  Current Outpatient Medications on File Prior to Visit  Medication Sig Dispense Refill  . alendronate (FOSAMAX) 70 MG tablet TAKE 1 TABLET BY MOUTH ONCE WEEKLY WITH A FULL GLASS OF WATER ON AN EMPTY STOMACH. AVOID LAYING FLAT FOR 1 HOUR 12 tablet 3  . atorvastatin (LIPITOR) 20 MG tablet TAKE 1 TABLET(20 MG) BY MOUTH DAILY FOR CHOLESTEROL 90 tablet 1  . Calcium Carbonate-Vitamin D (CALCIUM-D PO) Take by mouth daily.    . COMBIGAN 0.2-0.5 % ophthalmic solution Apply 1 drop to eye 2 (two) times daily.    Marland Kitchen levothyroxine (SYNTHROID) 88 MCG tablet TAKE 1 TABLET BY MOUTH EVERY MORNING ON AN EMPTY STOMACH. NO FOOD OR OTHER MEDICATION FOR 30 MINUTES 90 tablet 1  . losartan (COZAAR) 100 MG tablet TAKE 1 TABLET BY MOUTH EVERY DAY FOR BLOOD PRESSURE 90 tablet 1  . meloxicam (MOBIC) 15 MG tablet TAKE 1 TABLET(15 MG) BY MOUTH DAILY AS NEEDED FOR PAIN 90 tablet 1  . SYMBICORT 160-4.5 MCG/ACT inhaler Inhale 2 puffs into the lungs 2 (two) times daily.    Marland Kitchen tiotropium (SPIRIVA HANDIHALER) 18 MCG inhalation capsule Place 1 capsule (18 mcg total) into inhaler and inhale daily. 30 capsule 3   No current facility-administered medications on file prior to visit.    BP 122/60   Pulse  76   Temp 98 F (36.7 C) (Temporal)   Ht 5' 3.5" (1.613 m)   Wt 147 lb (66.7 kg)   SpO2 95%   BMI 25.63 kg/m    Objective:   Physical Exam HENT:     Right Ear: Tympanic membrane and ear canal normal.     Left Ear: Tympanic membrane and ear canal normal.  Eyes:     Pupils: Pupils are equal, round, and reactive to light.  Cardiovascular:     Rate and Rhythm: Normal rate and regular rhythm.  Pulmonary:     Effort: Pulmonary effort is normal.     Breath sounds: Normal breath sounds.     Comments: No throat clearing noted during  Abdominal:     General: Bowel sounds are  normal.     Palpations: Abdomen is soft.     Tenderness: There is no abdominal tenderness.  Musculoskeletal:        General: Normal range of motion.     Cervical back: Neck supple.  Skin:    General: Skin is warm and dry.  Neurological:     Mental Status: She is alert and oriented to person, place, and time.     Cranial Nerves: No cranial nerve deficit.     Deep Tendon Reflexes:     Reflex Scores:      Patellar reflexes are 2+ on the right side and 2+ on the left side. Psychiatric:        Mood and Affect: Mood normal.            Assessment & Plan:

## 2020-03-16 NOTE — Assessment & Plan Note (Signed)
Asymptomatic. Managed on statin therapy. Blood pressure under great control.

## 2020-03-16 NOTE — Progress Notes (Signed)
   Subjective:    Patient ID: Kelliann Pendergraph, female    DOB: 1932-06-16, 86 y.o.   MRN: [id number]  HPI   This visit occurred during the SARS-CoV-2 public health emergency.  Safety protocols were in place, including screening questions prior to the visit, additional usage of staff PPE, and extensive cleaning of exam room while observing appropriate contact time as indicated for disinfecting solutions.   Mrs. Loja is a 86 year old female with a history of hypertension, LBBB, GERD, COPD, hypothyroidism, osteoarthritis & hyperlipidemia who presents today for complete physical.  Immunizations: -Tetanus: UTD -Influenza: Completed  -Shingles: Completed in 2011 -Pneumonia: Completed   -Covid-19: Completed series    Diet: She is drinking a protein drink in the am, salads for lunch, and eggs and meat at night.   Exercise: Water aerobics 5 days a week at the [employer].   Eye exam: UTD  Dental exam: twice a year   Mammogram: UTD, completed 9/21 Dexa: completed 2020 T score -3.4  Colonoscopy: UTD   She wants to discuss her sleep. She was evaluated for a balance issues around 9/21 and at that time was decreasing her Lyrica down to 50mg  BID. She is now having increase in neuropathic pain worse at night. She did just finish PT which she did for 2 months, twice a week. She feels this was very helpful.   BP Readings from Last 3 Encounters:  03/16/20 122/60  01/12/20 130/76  01/06/20 138/74      Review of Systems  Constitutional: Negative.   HENT: Negative.   Respiratory: Positive for cough. Negative for chest tightness and shortness of breath.   Cardiovascular: Negative for chest pain and leg swelling.  Musculoskeletal: Negative.   Skin: Negative.   Neurological: Negative for dizziness, weakness and numbness.  Psychiatric/Behavioral: Negative.        Objective:   Physical Exam HENT:     Head: Normocephalic.  Eyes:     Pupils: Pupils are equal, round, and reactive to light.    Cardiovascular:     Rate and Rhythm: Normal rate and regular rhythm.     Pulses: Normal pulses.     Heart sounds: Normal heart sounds.  Pulmonary:     Effort: Pulmonary effort is normal.  Musculoskeletal:        General: Normal range of motion.     Cervical back: Normal range of motion.  Skin:    General: Skin is warm and dry.     Capillary Refill: Capillary refill takes less than 2 seconds.  Neurological:     General: No focal deficit present.     Mental Status: She is alert and oriented to person, place, and time.           Assessment & Plan:

## 2020-03-16 NOTE — Assessment & Plan Note (Addendum)
BP Readings from Last 3 Encounters:  03/16/20 122/60  01/12/20 130/76  01/06/20 138/74   Well controlled on losartan 100mg  once daily.  Continue this regimen.  CMP reviewed.   Agree with assessment and plan. Pleas Koch, NP

## 2020-04-06 ENCOUNTER — Other Ambulatory Visit: Payer: Self-pay | Admitting: Family Medicine

## 2020-05-02 ENCOUNTER — Other Ambulatory Visit: Payer: Self-pay | Admitting: Primary Care

## 2020-05-03 ENCOUNTER — Other Ambulatory Visit: Payer: Self-pay | Admitting: Primary Care

## 2020-05-04 ENCOUNTER — Telehealth: Payer: Self-pay | Admitting: Primary Care

## 2020-05-04 NOTE — Telephone Encounter (Signed)
Have updated records

## 2020-05-04 NOTE — Telephone Encounter (Signed)
Patient called in stating that we need to update all her medications to be sent going forward to [facility] order. She is no longer using [facility] for her prescriptions.  Their phone # [phone]

## 2020-05-06 ENCOUNTER — Other Ambulatory Visit: Payer: Self-pay

## 2020-05-06 DIAGNOSIS — M810 Age-related osteoporosis without current pathological fracture: Secondary | ICD-10-CM

## 2020-05-06 MED ORDER — SPIRIVA HANDIHALER 18 MCG IN CAPS: ORAL_CAPSULE | RESPIRATORY_TRACT | 3 refills | Status: DC

## 2020-05-06 MED ORDER — ALENDRONATE SODIUM 70 MG PO TABS: ORAL_TABLET | ORAL | 3 refills | Status: AC

## 2020-05-14 ENCOUNTER — Other Ambulatory Visit: Payer: Self-pay

## 2020-05-14 DIAGNOSIS — G629 Polyneuropathy, unspecified: Secondary | ICD-10-CM

## 2020-05-14 MED ORDER — PREGABALIN 50 MG PO CAPS: ORAL_CAPSULE | ORAL | 0 refills | Status: DC

## 2020-05-14 NOTE — Telephone Encounter (Signed)
Refills sent to pharmacy. 

## 2020-05-14 NOTE — Telephone Encounter (Signed)
Pharmacy requests refill on: Pregabalin 50 mg  LAST REFILL: 03/16/2020 (Q-270, R-0) LAST OV: 03/16/2020 NEXT OV: Not Scheduled  PHARMACY: [facility]

## 2020-05-20 ENCOUNTER — Other Ambulatory Visit: Payer: Self-pay

## 2020-05-20 DIAGNOSIS — E785 Hyperlipidemia, unspecified: Secondary | ICD-10-CM

## 2020-05-20 DIAGNOSIS — E039 Hypothyroidism, unspecified: Secondary | ICD-10-CM

## 2020-05-20 DIAGNOSIS — I10 Essential (primary) hypertension: Secondary | ICD-10-CM

## 2020-05-20 MED ORDER — ATORVASTATIN CALCIUM 20 MG PO TABS: ORAL_TABLET | ORAL | 0 refills | Status: DC

## 2020-05-20 MED ORDER — LOSARTAN POTASSIUM 100 MG PO TABS: ORAL_TABLET | ORAL | 0 refills | Status: DC

## 2020-06-28 ENCOUNTER — Other Ambulatory Visit: Payer: Self-pay

## 2020-06-28 DIAGNOSIS — E039 Hypothyroidism, unspecified: Secondary | ICD-10-CM

## 2020-06-28 MED ORDER — LEVOTHYROXINE SODIUM 88 MCG PO TABS
ORAL_TABLET | ORAL | 1 refills | Status: AC
Start: 2020-06-28 — End: ?

## 2020-07-05 ENCOUNTER — Other Ambulatory Visit: Payer: Self-pay | Admitting: Primary Care

## 2020-07-05 DIAGNOSIS — G629 Polyneuropathy, unspecified: Secondary | ICD-10-CM

## 2020-07-06 ENCOUNTER — Other Ambulatory Visit: Payer: Self-pay | Admitting: *Deleted

## 2020-07-06 DIAGNOSIS — J449 Chronic obstructive pulmonary disease, unspecified: Secondary | ICD-10-CM

## 2020-07-06 NOTE — Telephone Encounter (Signed)
LAST APPOINTMENT DATE: 03/16/2020   NEXT APPOINTMENT DATE: Visit date not found    LAST REFILL:05/14/2020  QTY:270 0 refills

## 2020-07-06 NOTE — Telephone Encounter (Signed)
Patient left a voicemail stating that she needs for Allie Bossier NP to send a script in for her Symbicort. Patient stated that the script needs to go to [facility].  Last refill 05/03/20  30.6 G Last office visit 03/16/20 No upcoming appointment scheduled

## 2020-07-07 NOTE — Telephone Encounter (Signed)
Refills sent to pharmacy. 

## 2020-07-07 NOTE — Telephone Encounter (Signed)
It looks like I ordered a three month supply 2 months ago. Is she out or is this to stock up with the mail order pharmacy? She should have one month remaining. If needing to stock up with refills for pharmacy, then okay to fill as pended.

## 2020-07-08 MED ORDER — BUDESONIDE-FORMOTEROL FUMARATE 160-4.5 MCG/ACT IN AERO: 2.0000 | INHALATION_SPRAY | Freq: Two times a day (BID) | RESPIRATORY_TRACT | 1 refills | Status: DC

## 2020-07-08 NOTE — Telephone Encounter (Signed)
Called patient she wanted refill sent to mail order so it will be back before she runs out of what she has on hand now.

## 2020-07-08 NOTE — Telephone Encounter (Signed)
Left message to return call to our office.  

## 2020-07-13 ENCOUNTER — Encounter: Payer: Self-pay | Admitting: Primary Care

## 2020-07-13 ENCOUNTER — Ambulatory Visit: Payer: Medicare Other | Admitting: Primary Care

## 2020-07-13 ENCOUNTER — Other Ambulatory Visit: Payer: Self-pay

## 2020-07-13 VITALS — BP 130/82 | HR 82 | Temp 98.2°F | Ht 63.5 in | Wt 156.0 lb

## 2020-07-13 DIAGNOSIS — H6120 Impacted cerumen, unspecified ear: Secondary | ICD-10-CM | POA: Insufficient documentation

## 2020-07-13 DIAGNOSIS — H6123 Impacted cerumen, bilateral: Secondary | ICD-10-CM | POA: Diagnosis not present

## 2020-07-13 DIAGNOSIS — M5442 Lumbago with sciatica, left side: Secondary | ICD-10-CM | POA: Diagnosis not present

## 2020-07-13 DIAGNOSIS — K219 Gastro-esophageal reflux disease without esophagitis: Secondary | ICD-10-CM | POA: Diagnosis not present

## 2020-07-13 MED ORDER — PREDNISONE 20 MG PO TABS: ORAL_TABLET | ORAL | 0 refills | Status: AC

## 2020-07-13 NOTE — Patient Instructions (Addendum)
Try switching to Zyrtec from Allegra for throat clearing.   Continue using the Pepcid AC as needed for belching. Please notify me if your symptoms increase.  Start prednisone tablets for your back. Take two tablets my mouth once daily for four days, then one tablet once daily for four days.   It was a pleasure to see you today!   Food Choices for Gastroesophageal Reflux Disease, Adult When you have gastroesophageal reflux disease (GERD), the foods you eat and your eating habits are very important. Choosing the right foods can help ease your discomfort. Think about working with a food expert (dietitian) to help you make good choices. What are tips for following this plan? Reading food labels  Look for foods that are low in saturated fat. Foods that may help with your symptoms include: ? Foods that have less than 5% of daily value (DV) of fat. ? Foods that have 0 grams of trans fat. Cooking  Do not fry your food.  Cook your food by baking, steaming, grilling, or broiling. These are all methods that do not need a lot of fat for cooking.  To add flavor, try to use herbs that are low in spice and acidity. Meal planning  Choose healthy foods that are low in fat, such as: ? Fruits and vegetables. ? Whole grains. ? Low-fat dairy products. ? Lean meats, fish, and poultry.  Eat small meals often instead of eating 3 large meals each day. Eat your meals slowly in a place where you are relaxed. Avoid bending over or lying down until 2-3 hours after eating.  Limit high-fat foods such as fatty meats or fried foods.  Limit your intake of fatty foods, such as oils, butter, and shortening.  Avoid the following as told by your doctor: ? Foods that cause symptoms. These may be different for different people. Keep a food diary to keep track of foods that cause symptoms. ? Alcohol. ? Drinking a lot of liquid with meals. ? Eating meals during the 2-3 hours before bed.   Lifestyle  Stay at a  healthy weight. Ask your doctor what weight is healthy for you. If you need to lose weight, work with your doctor to do so safely.  Exercise for at least 30 minutes on 5 or more days each week, or as told by your doctor.  Wear loose-fitting clothes.  Do not smoke or use any products that contain nicotine or tobacco. If you need help quitting, ask your doctor.  Sleep with the head of your bed higher than your feet. Use a wedge under the mattress or blocks under the bed frame to raise the head of the bed.  Chew sugar-free gum after meals. What foods should eat? Eat a healthy, well-balanced diet of fruits, vegetables, whole grains, low-fat dairy products, lean meats, fish, and poultry. Each person is different. Foods that may cause symptoms in one person may not cause any symptoms in another person. Work with your doctor to find foods that are safe for you. The items listed above may not be a complete list of what you can eat and drink. Contact a food expert for more options.   What foods should I avoid? Limiting some of these foods may help in managing the symptoms of GERD. Everyone is different. Talk with a food expert or your doctor to help you find the exact foods to avoid, if any. Fruits Any fruits prepared with added fat. Any fruits that cause symptoms. For some people, this  may include citrus fruits, such as oranges, grapefruit, pineapple, and lemons. Vegetables Deep-fried vegetables. [place] fries. Any vegetables prepared with added fat. Any vegetables that cause symptoms. For some people, this may include tomatoes and tomato products, chili peppers, onions and garlic, and horseradish. Grains Pastries or quick breads with added fat. Meats and other proteins High-fat meats, such as fatty beef or pork, hot dogs, ribs, ham, sausage, salami, and bacon. Fried meat or protein, including fried fish and fried chicken. Nuts and nut butters, in large amounts. Dairy Whole milk and chocolate milk.  Sour cream. Cream. Ice cream. Cream cheese. Milkshakes. Fats and oils Butter. Margarine. Shortening. Ghee. Beverages Coffee and tea, with or without caffeine. Carbonated beverages. Sodas. Energy drinks. Fruit juice made with acidic fruits, such as orange or grapefruit. Tomato juice. Alcoholic drinks. Sweets and desserts Chocolate and cocoa. Donuts. Seasonings and condiments Pepper. Peppermint and spearmint. Added salt. Any condiments, herbs, or seasonings that cause symptoms. For some people, this may include curry, hot sauce, or vinegar-based salad dressings. The items listed above may not be a complete list of what you should not eat and drink. Contact a food expert for more options. Questions to ask your doctor Diet and lifestyle changes are often the first steps that are taken to manage symptoms of GERD. If diet and lifestyle changes do not help, talk with your doctor about taking medicines. Where to find more information  International Foundation for Gastrointestinal Disorders: aboutgerd.org Summary  When you have GERD, food and lifestyle choices are very important in easing your symptoms.  Eat small meals often instead of 3 large meals a day. Eat your meals slowly and in a place where you are relaxed.  Avoid bending over or lying down until 2-3 hours after eating.  Limit high-fat foods such as fatty meats or fried foods. This information is not intended to replace advice given to you by your health care provider. Make sure you discuss any questions you have with your health care provider. Document Revised: 10/27/2019 Document Reviewed: 10/27/2019 Elsevier Patient Education  [place].

## 2020-07-13 NOTE — Assessment & Plan Note (Signed)
Intermittent belching, discussed that it was okay to use Pepcid PRN. She will update if symptoms persist/increase.

## 2020-07-13 NOTE — Assessment & Plan Note (Signed)
Noted bilaterally. Patient consented to irrigation. Bilateral canals irrigated, patient tolerated well. Canals and TM's post irrigation unremarkable.

## 2020-07-13 NOTE — Assessment & Plan Note (Signed)
Acute for the last month, now with sciatica to left lower extremity.   Rx for prednisone course sent to pharmacy.  Discussed use of Tylenol HS for pain if needed.  Continue water aerobics and stretching.  Consider imaging/PT if no improvement.

## 2020-07-13 NOTE — Progress Notes (Signed)
Subjective:    Patient ID: Michelle Ball, female    DOB: 13-Jun-1932, 87 y.o.   MRN: [id number]  HPI  Michelle Ball is a very pleasant 87 y.o. female with a history of osteoarthritis, COPD, hypothyroidism, neuropathy, thoracic scoliosis who presents today to discuss back pain and belching. She also believes she may have ear wax in her ears.   Her pain is located to the left lower lumbar back which has been intermittent for the last month. Over the last week she's noticed radiation of pain down to her left lower extremity.   Currently managed on Lyrica 50 mg in AM and 100 mg in PM for chronic neuropathic pain. Also managed on Meloxicam 15 mg for which she takes nightly for her chronic shoulder pain.   She denies injury/trauma, loss of bowel/bladder control. She has taken Melatonin and Tylenol for her pain if she wakes during the night. She is doing water aerobics five days weekly.   She's noticed intermittent belching for the last month, more so noticeable if she eats later at night. Prior history of GERD, hasn't had issues in years. She has been taking Pepcid Complete as needed with resolve in symptoms.   BP Readings from Last 3 Encounters:  07/13/20 130/82  03/16/20 122/60  01/12/20 130/76   Wt Readings from Last 3 Encounters:  07/13/20 156 lb (70.8 kg)  03/16/20 147 lb (66.7 kg)  01/12/20 146 lb (66.2 kg)     Review of Systems  HENT:       Ear fullness  Gastrointestinal:       Belching   Musculoskeletal: Positive for arthralgias and back pain.  Skin: Negative for color change.       epic:[email or web address]?[email or web address]&&[id number]&&&&&False&Default  Past Medical History:  Diagnosis Date  . Arthritis   . COPD (chronic obstructive pulmonary disease) ([place])   . Environmental and seasonal allergies   . Hyperlipidemia   . Hypertension   . Neuropathy   . Thyroid disease     Social  History   Socioeconomic History  . Marital status: Married    Spouse name: Not on file  . Number of children: Not on file  . Years of education: Not on file  . Highest education level: Not on file  Occupational History  . Not on file  Tobacco Use  . Smoking status: Former Research scientist (life sciences)  . Smokeless tobacco: Never Used  Vaping Use  . Vaping Use: Never used  Substance and Sexual Activity  . Alcohol use: Never  . Drug use: Never  . Sexual activity: Not Currently  Other Topics Concern  . Not on file  Social History Narrative  . Not on file   Social Determinants of Health   Financial Resource Strain: Low Risk   . Difficulty of Paying Living Expenses: Not hard at all  Food Insecurity: No Food Insecurity  . Worried About Charity fundraiser in the Last Year: Never true  . Ran Out of Food in the Last Year: Never true  Transportation Needs: No Transportation Needs  . Lack of Transportation (Medical): No  . Lack of Transportation (Non-Medical): No  Physical Activity: Sufficiently Active  . Days of Exercise per Week: 5 days  . Minutes of Exercise per Session: 50 min  Stress: No Stress Concern Present  . Feeling of Stress : Not at all  Social Connections: Not on file  Intimate Partner Violence: Not At Risk  . Fear of Current or Ex-Partner: No  . Emotionally Abused: No  .  Physically Abused: No  . Sexually Abused: No    Past Surgical History:  Procedure Laterality Date  . ABDOMINAL HYSTERECTOMY    . APPENDECTOMY    . CHOLECYSTECTOMY    . REPLACEMENT TOTAL KNEE Right 2016  . TONSILLECTOMY AND ADENOIDECTOMY      Family History  Problem Relation Age of Onset  . Arthritis Mother   . Cancer Mother   . Arthritis Father   . Early death Father   . Heart disease Father   . Heart attack Father   . Hyperlipidemia Father   . Hypertension Father   . Arthritis Sister   . Depression Sister   . Hypertension Sister   . Hyperlipidemia Sister   . Breast cancer Neg Hx     No Known  Allergies  Current Outpatient Medications on File Prior to Visit  Medication Sig Dispense Refill  . alendronate (FOSAMAX) 70 MG tablet Take with a full glass of water on an empty stomach. 12 tablet 3  . atorvastatin (LIPITOR) 20 MG tablet TAKE 1 TABLET(20 MG) BY MOUTH DAILY FOR CHOLESTEROL 90 tablet 0  . budesonide-formoterol (SYMBICORT) 160-4.5 MCG/ACT inhaler Inhale 2 puffs into the lungs 2 (two) times daily. 3 each 1  . Calcium Carbonate-Vitamin D (CALCIUM-D PO) Take by mouth daily.    . COMBIGAN 0.2-0.5 % ophthalmic solution Apply 1 drop to eye 2 (two) times daily.    Marland Kitchen levothyroxine (SYNTHROID) 88 MCG tablet Take one tablet by mouth every morning on an empty stomach. No food or other medications for 30 minutes. For Thyroid. 90 tablet 1  . losartan (COZAAR) 100 MG tablet TAKE 1 TABLET BY MOUTH EVERY DAY FOR BLOOD PRESSURE 90 tablet 0  . meloxicam (MOBIC) 15 MG tablet TAKE 1 TABLET(15 MG) BY MOUTH DAILY AS NEEDED FOR PAIN 90 tablet 1  . pregabalin (LYRICA) 50 MG capsule TAKE 1 CAPSULE BY MOUTH IN  THE MORNING AND 2 CAPSULES  IN THE EVENING for pain. 270 capsule 0  . tiotropium (SPIRIVA HANDIHALER) 18 MCG inhalation capsule INHALE CONTENTS OF 1 CAPSULE ONCE DAILY USING HANDIHALER 90 capsule 3   No current facility-administered medications on file prior to visit.    Temp 98.2 F (36.8 C) (Temporal)   Ht 5' 3.5" (1.613 m)   Wt 156 lb (70.8 kg)   BMI 27.20 kg/m  Objective:   Physical Exam HENT:     Right Ear: There is impacted cerumen.     Left Ear: There is impacted cerumen.  Cardiovascular:     Rate and Rhythm: Normal rate and regular rhythm.  Pulmonary:     Effort: Pulmonary effort is normal.     Breath sounds: Normal breath sounds.  Musculoskeletal:     Cervical back: Neck supple.       Back:  Skin:    General: Skin is warm and dry.           Assessment & Plan:      This visit occurred during the SARS-CoV-2 public health emergency.  Safety protocols were in  place, including screening questions prior to the visit, additional usage of staff PPE, and extensive cleaning of exam room while observing appropriate contact time as indicated for disinfecting solutions.

## 2020-08-12 ENCOUNTER — Other Ambulatory Visit: Payer: Self-pay

## 2020-08-12 ENCOUNTER — Ambulatory Visit: Payer: Medicare Other | Admitting: Dermatology

## 2020-08-12 DIAGNOSIS — D1800 Hemangioma unspecified site: Secondary | ICD-10-CM

## 2020-08-12 DIAGNOSIS — Z1283 Encounter for screening for malignant neoplasm of skin: Secondary | ICD-10-CM

## 2020-08-12 DIAGNOSIS — Z85828 Personal history of other malignant neoplasm of skin: Secondary | ICD-10-CM | POA: Diagnosis not present

## 2020-08-12 DIAGNOSIS — L570 Actinic keratosis: Secondary | ICD-10-CM | POA: Diagnosis not present

## 2020-08-12 DIAGNOSIS — D229 Melanocytic nevi, unspecified: Secondary | ICD-10-CM

## 2020-08-12 DIAGNOSIS — L578 Other skin changes due to chronic exposure to nonionizing radiation: Secondary | ICD-10-CM | POA: Diagnosis not present

## 2020-08-12 DIAGNOSIS — L820 Inflamed seborrheic keratosis: Secondary | ICD-10-CM

## 2020-08-12 DIAGNOSIS — L821 Other seborrheic keratosis: Secondary | ICD-10-CM

## 2020-08-12 DIAGNOSIS — L814 Other melanin hyperpigmentation: Secondary | ICD-10-CM | POA: Diagnosis not present

## 2020-08-12 NOTE — Patient Instructions (Addendum)
Melanoma ABCDEs  Melanoma is the most dangerous type of skin cancer, and is the leading cause of death from skin disease.  You are more likely to develop melanoma if you:  Have light-colored skin, light-colored eyes, or red or blond hair  Spend a lot of time in the sun  Tan regularly, either outdoors or in a tanning bed  Have had blistering sunburns, especially during childhood  Have a close family member who has had a melanoma  Have atypical moles or large birthmarks  Early detection of melanoma is key since treatment is typically straightforward and cure rates are extremely high if we catch it early.   The first sign of melanoma is often a change in a mole or a new dark spot.  The ABCDE system is a way of remembering the signs of melanoma.  A for asymmetry:  The two halves do not match. B for border:  The edges of the growth are irregular. C for color:  A mixture of colors are present instead of an even brown color. D for diameter:  Melanomas are usually (but not always) greater than 76mm - the size of a pencil eraser. E for evolution:  The spot keeps changing in size, shape, and color.  Please check your skin once per month between visits. You can use a small mirror in front and a large mirror behind you to keep an eye on the back side or your body.   If you see any new or changing lesions before your next follow-up, please call to schedule a visit.  Please continue daily skin protection including broad spectrum sunscreen SPF 30+ to sun-exposed areas, reapplying every 2 hours as needed when you're outdoors.   Cryotherapy Aftercare  . Wash gently with soap and water everyday.   Marland Kitchen Apply Vaseline and Band-Aid daily until healed.  Prior to procedure, discussed risks of blister formation, small wound, skin dyspigmentation, or rare scar following cryotherapy.   Recommend daily broad spectrum sunscreen SPF 30+ to sun-exposed areas, reapply every 2 hours as needed. Call for new or  changing lesions.  Staying in the shade or wearing long sleeves, sun glasses (UVA+UVB protection) and wide brim hats (4-inch brim around the entire circumference of the hat) are also recommended for sun protection.    If you have any questions or concerns for your doctor, please call our main line at [phone] and press option 4 to reach your doctor's medical assistant. If no one answers, please leave a voicemail as directed and we will return your call as soon as possible. Messages left after 4 pm will be answered the following business day.   You may also send [place] a message via [place]. We typically respond to MyChart messages within 1-2 business days.  For prescription refills, please ask your pharmacy to contact our office. Our fax number is [phone].  If you have an urgent issue when the clinic is closed that cannot wait until the next business day, you can page your doctor at the number below.    Please note that while we do our best to be available for urgent issues outside of office hours, we are not available 24/7.   If you have an urgent issue and are unable to reach [place], you may choose to seek medical care at your doctor's office, retail clinic, urgent care center, or emergency room.  If you have a medical emergency, please immediately call 911 or go to the emergency department.  Pager Numbers  -  Dr. Nehemiah Massed: [phone]  - Dr. Laurence Ferrari: [phone]  - Dr. Nicole Kindred: [phone]  In the event of inclement weather, please call our main line at [phone] for an update on the status of any delays or closures.  Dermatology Medication Tips: Please keep the boxes that topical medications come in in order to help keep track of the instructions about where and how to use these. Pharmacies typically print the medication instructions only on the boxes and not directly on the medication tubes.   If your medication is too expensive, please contact our office at [phone] option  4 or send [place] a message through [place].   We are unable to tell what your co-pay for medications will be in advance as this is different depending on your insurance coverage. However, we may be able to find a substitute medication at lower cost or fill out paperwork to get insurance to cover a needed medication.   If a prior authorization is required to get your medication covered by your insurance company, please allow [place] 1-2 business days to complete this process.  Drug prices often vary depending on where the prescription is filled and some pharmacies may offer cheaper prices.  The website www.goodrx.com contains coupons for medications through different pharmacies. The prices here do not account for what the cost may be with help from insurance (it may be cheaper with your insurance), but the website can give you the price if you did not use any insurance.  - You can print the associated coupon and take it with your prescription to the pharmacy.  - You may also stop by our office during regular business hours and pick up a GoodRx coupon card.  - If you need your prescription sent electronically to a different pharmacy, notify our office through [facility] or by phone at [phone] option 4.

## 2020-08-12 NOTE — Progress Notes (Signed)
New Patient Visit  Subjective  Michelle Ball is a 87 y.o. female who presents for the following: FBSE (Patient here for full body skin exam and skin cancer screening. Patient has some dark spots at face, a spot at right eyebrow that gets a scab and does not heal and has been there for a long time. She also has a spot at right post leg that feels like a wart. ).  Patient advises she had a skin cancer treated at the right arm > 3 years ago in [place], [place]. Will request records.  The following portions of the chart were reviewed this encounter and updated as appropriate:   Tobacco  Allergies  Meds  Problems  Med Hx  Surg Hx  Fam Hx      Review of Systems:  No other skin or systemic complaints except as noted in HPI or Assessment and Plan.  Objective  Well appearing patient in no apparent distress; mood and affect are within normal limits.  A full examination was performed including scalp, head, eyes, ears, nose, lips, neck, chest, axillae, abdomen, back, buttocks, bilateral upper extremities, bilateral lower extremities, hands, feet, fingers, toes, fingernails, and toenails. All findings within normal limits unless otherwise noted below.  Objective  R Forearm x 3, R dorsal hand x 3, R upper arm x 2, chest x 9, R lat eyebrow x 1, L forehead x 1, L neck x 1, L dorsal hand x 4, L forearm x 11, L upper arm x 3, L inner thigh x 1, pretibia x 6, R pretibia x 1 (46): Erythematous thin papules/macules with gritty scale.   Objective  Right post thigh x 1, right post calf x 1 (2): Erythematous keratotic or waxy stuck-on papule or plaque.    Assessment & Plan  AK (actinic keratosis) (46) R Forearm x 3, R dorsal hand x 3, R upper arm x 2, chest x 9, R lat eyebrow x 1, L forehead x 1, L neck x 1, L dorsal hand x 4, L forearm x 11, L upper arm x 3, L inner thigh x 1, pretibia x 6, R pretibia x 1  Prior to procedure, discussed risks of blister formation, small wound, skin dyspigmentation,  or rare scar following cryotherapy.   Consider PDT or topical treatment.   Hypertrophic at right lateral eyebrow.   Destruction of lesion - R Forearm x 3, R dorsal hand x 3, R upper arm x 2, chest x 9, R lat eyebrow x 1, L forehead x 1, L neck x 1, L dorsal hand x 4, L forearm x 11, L upper arm x 3, L inner thigh x 1, pretibia x 6, R pretibia x 1  Destruction method: cryotherapy   Informed consent: discussed and consent obtained   Lesion destroyed using liquid nitrogen: Yes   Cryotherapy cycles:  2 Outcome: patient tolerated procedure well with no complications   Post-procedure details: wound care instructions given    Inflamed seborrheic keratosis (2) Right post thigh x 1, right post calf x 1  Prior to procedure, discussed risks of blister formation, small wound, skin dyspigmentation, or rare scar following cryotherapy.   Recheck on follow up  Destruction of lesion - Right post thigh x 1, right post calf x 1  Destruction method: cryotherapy   Informed consent: discussed and consent obtained   Lesion destroyed using liquid nitrogen: Yes   Cryotherapy cycles:  2 Outcome: patient tolerated procedure well with no complications   Post-procedure details: wound  care instructions given    Lentigines - Scattered tan macules - Due to sun exposure - Benign-appering, observe - Recommend daily broad spectrum sunscreen SPF 30+ to sun-exposed areas, reapply every 2 hours as needed. - Call for any changes  Seborrheic Keratoses - Stuck-on, waxy, tan-brown papules and/or plaques  - Benign-appearing - Discussed benign etiology and prognosis. - Observe - Call for any changes  Melanocytic Nevi - Tan-brown and/or pink-flesh-colored symmetric macules and papules - Benign appearing on exam today - Observation - Call clinic for new or changing moles - Recommend daily use of broad spectrum spf 30+ sunscreen to sun-exposed areas.   Hemangiomas - Red papules - Discussed benign nature -  Observe - Call for any changes  Actinic Damage - Severe, confluent actinic changes with pre-cancerous actinic keratoses  - Severe, chronic, not at goal, secondary to cumulative UV radiation exposure over time - diffuse scaly erythematous macules and papules with underlying dyspigmentation - Discussed Prescription "Field Treatment" for Severe, Chronic Confluent Actinic Changes with Pre-Cancerous Actinic Keratoses Field treatment involves treatment of an entire area of skin that has confluent Actinic Changes (Sun/ Ultraviolet light damage) and PreCancerous Actinic Keratoses by method of PhotoDynamic Therapy (PDT) and/or prescription Topical Chemotherapy agents such as 5-fluorouracil, 5-fluorouracil/calcipotriene, and/or imiquimod.  The purpose is to decrease the number of clinically evident and subclinical PreCancerous lesions to prevent progression to development of skin cancer by chemically destroying early precancer changes that may or may not be visible.  It has been shown to reduce the risk of developing skin cancer in the treated area. As a result of treatment, redness, scaling, crusting, and open sores may occur during treatment course. One or more than one of these methods may be used and may have to be used several times to control, suppress and eliminate the PreCancerous changes. Discussed treatment course, expected reaction, and possible side effects. - Recommend daily broad spectrum sunscreen SPF 30+ to sun-exposed areas, reapply every 2 hours as needed.  - Staying in the shade or wearing long sleeves, sun glasses (UVA+UVB protection) and wide brim hats (4-inch brim around the entire circumference of the hat) are also recommended. - Call for new or changing lesions. - Deferred treatment at this time. May consider at follow-up.   Skin cancer screening performed today.  History of Skin Cancer  -Clear at right upper arm. Observe for recurrence.  - No cervical, axillary or inguinal  lymphadenopathy on exam today -Call clinic for new or changing lesions.   -Recommend regular skin exams, daily broad-spectrum spf 30+ sunscreen use, and photoprotection.     Return in about 2 months (around 10/12/2020) for AK, ISK follow up.  Graciella Belton, RMA, am acting as scribe for Forest Gleason, MD .  Documentation: I have reviewed the above documentation for accuracy and completeness, and I agree with the above.  Forest Gleason, MD

## 2020-08-13 ENCOUNTER — Encounter: Payer: Self-pay | Admitting: Dermatology

## 2020-08-30 ENCOUNTER — Other Ambulatory Visit: Payer: Self-pay | Admitting: Primary Care

## 2020-08-30 DIAGNOSIS — E785 Hyperlipidemia, unspecified: Secondary | ICD-10-CM

## 2020-08-30 DIAGNOSIS — I10 Essential (primary) hypertension: Secondary | ICD-10-CM

## 2020-09-12 ENCOUNTER — Other Ambulatory Visit: Payer: Self-pay | Admitting: Primary Care

## 2020-09-12 DIAGNOSIS — M8949 Other hypertrophic osteoarthropathy, multiple sites: Secondary | ICD-10-CM

## 2020-09-12 DIAGNOSIS — M159 Polyosteoarthritis, unspecified: Secondary | ICD-10-CM

## 2020-09-28 ENCOUNTER — Telehealth: Payer: Self-pay | Admitting: *Deleted

## 2020-09-28 DIAGNOSIS — J449 Chronic obstructive pulmonary disease, unspecified: Secondary | ICD-10-CM

## 2020-09-28 NOTE — Telephone Encounter (Signed)
If she's experiencing post nasal drip then an antihistamine may be the best option. I don't see one on her medication list, would recommend generic Zyrtec at bedtime.   She is at max dose of Symbicort and Spiriva. Make sure she's been using these both everyday.   Does she have a rescue/albuterol inhaler? If not then I'll send one to the pharmacy, this is to be used only if needed.

## 2020-09-28 NOTE — Telephone Encounter (Addendum)
Patient left a voicemail stating  that she is having to clear her throat more often in the afternoon and evening and when she exerts herself. Patient wants to know if she should adjust her Cortisone, Symbicort or Spiriva?

## 2020-09-29 MED ORDER — ALBUTEROL SULFATE HFA 108 (90 BASE) MCG/ACT IN AERS: 2.0000 | INHALATION_SPRAY | Freq: Four times a day (QID) | RESPIRATORY_TRACT | 2 refills | Status: DC | PRN

## 2020-09-29 MED ORDER — ALBUTEROL SULFATE HFA 108 (90 BASE) MCG/ACT IN AERS: 2.0000 | INHALATION_SPRAY | Freq: Four times a day (QID) | RESPIRATORY_TRACT | 2 refills | Status: AC | PRN

## 2020-09-29 NOTE — Telephone Encounter (Signed)
Have called and c/a the mail order script.

## 2020-09-29 NOTE — Telephone Encounter (Signed)
Albuterol sent to [facility].   Agree with trial of zyrtec.   If no improvement would recommend follow-up appointment with pcp.

## 2020-09-29 NOTE — Telephone Encounter (Signed)
Patient states that the "clearing of her throat" she is not having any post nasal drip. Symptoms are only late afternoon into evening. She is happy to try the rescue inhaler. She is taking Symbicort and spriva as directed.  Wanted to know if you would like her to come in to follow up on symptoms.    She wold like called into [facility]

## 2020-11-03 ENCOUNTER — Other Ambulatory Visit: Payer: Self-pay | Admitting: Primary Care

## 2020-11-03 DIAGNOSIS — J449 Chronic obstructive pulmonary disease, unspecified: Secondary | ICD-10-CM

## 2020-11-04 ENCOUNTER — Other Ambulatory Visit: Payer: Self-pay

## 2020-11-04 ENCOUNTER — Ambulatory Visit: Payer: Medicare Other | Admitting: Dermatology

## 2020-11-04 DIAGNOSIS — J449 Chronic obstructive pulmonary disease, unspecified: Secondary | ICD-10-CM

## 2020-11-04 MED ORDER — SPIRIVA HANDIHALER 18 MCG IN CAPS: ORAL_CAPSULE | RESPIRATORY_TRACT | 0 refills | Status: DC

## 2020-11-04 NOTE — Telephone Encounter (Signed)
Pt left v/m requesting 30 day rx for Spiriva to [facility] [address] at [employer] because pt is out of spiriva and [facility] has not received response from [facility] for requested spiriva refills. Pt wants to pick up spiriva 11/04/20 or 11/05/20. Sending note to Gentry Fitz NP.

## 2020-11-04 NOTE — Telephone Encounter (Signed)
We sent a one year refill of Spiriva in January 2022, what's the issue? Will send a refill to [facility].

## 2020-11-12 NOTE — Telephone Encounter (Signed)
Called mail order has refill on file should not be issue in the future.

## 2020-11-25 ENCOUNTER — Ambulatory Visit: Payer: Medicare Other | Admitting: Dermatology

## 2020-11-29 DEATH — deceased

## 2020-12-10 ENCOUNTER — Other Ambulatory Visit: Payer: Self-pay | Admitting: Primary Care

## 2020-12-10 DIAGNOSIS — J449 Chronic obstructive pulmonary disease, unspecified: Secondary | ICD-10-CM

## 2021-03-17 ENCOUNTER — Encounter: Payer: Medicare Other | Admitting: Primary Care

## 2022-08-24 IMAGING — MG DIGITAL SCREENING BILAT W/ TOMO W/ CAD
6 of 10 series · 6 of 30 positions shown · non-contrast
Comparison: Previous exam(s).

CLINICAL DATA: Screening.

EXAM:
DIGITAL SCREENING BILATERAL MAMMOGRAM WITH TOMO AND CAD

[L MLO synth-2D (1 of 2)]
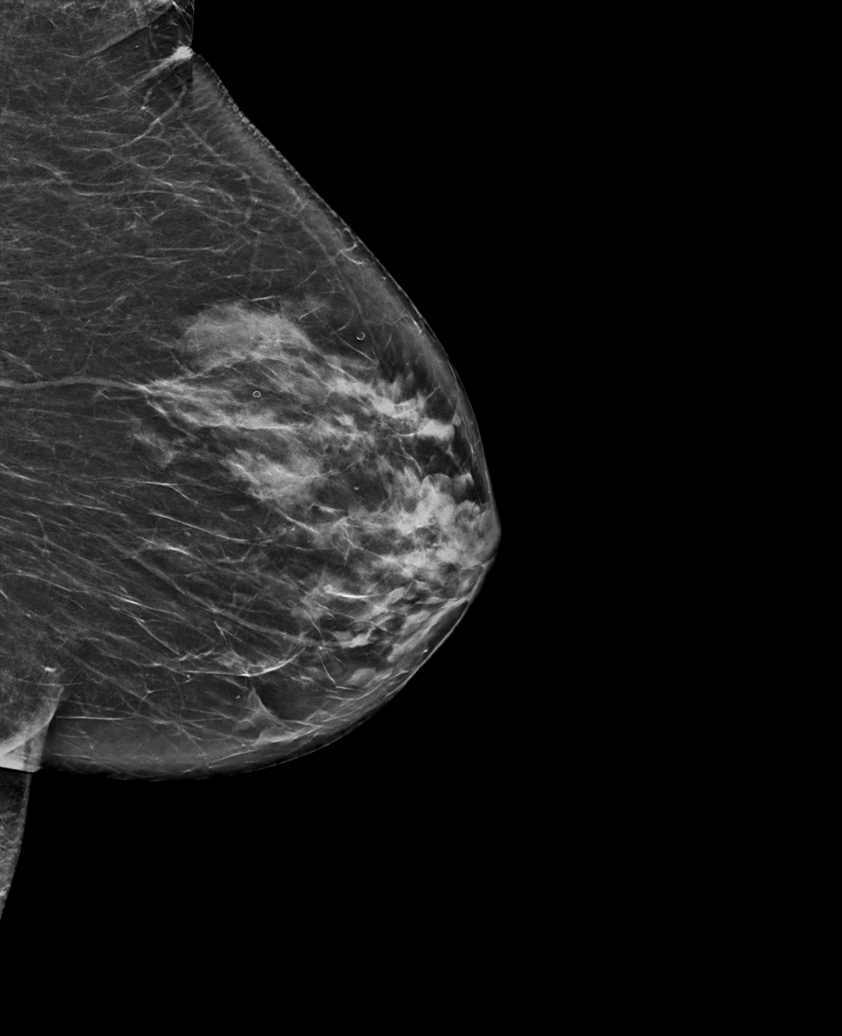

[L MLO synth-2D (2 of 2)]
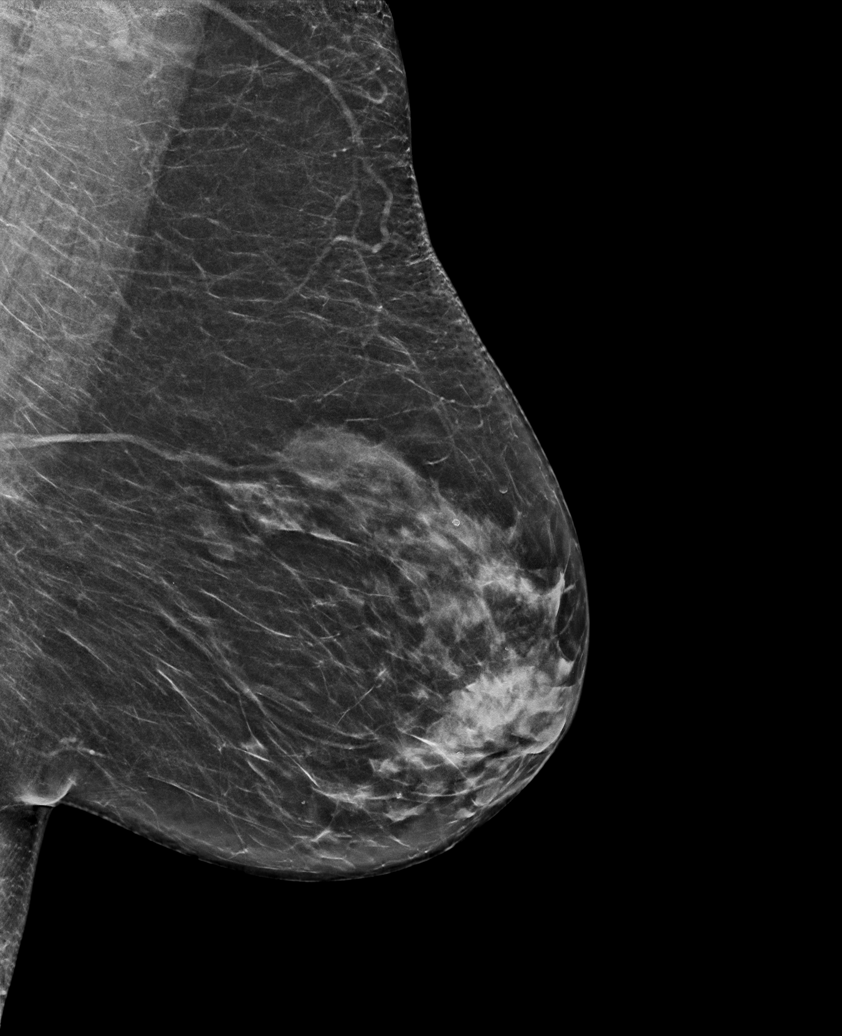

[R MLO synth-2D]
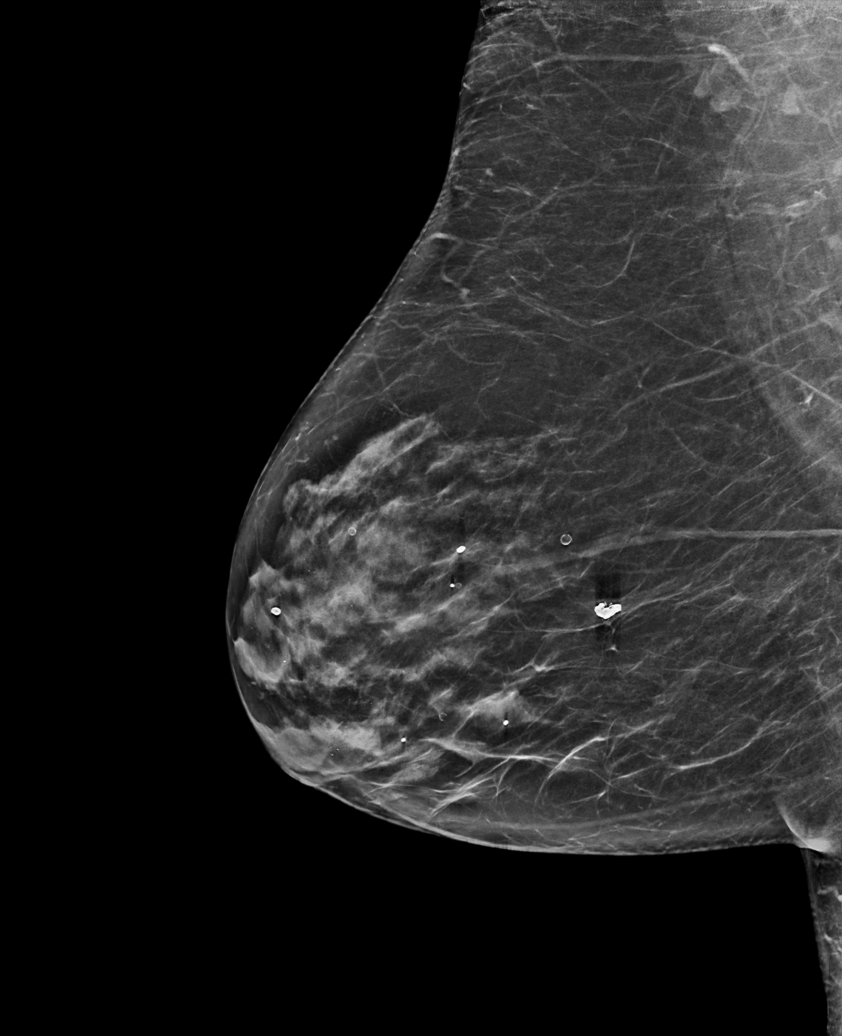

[R CC synth-2D]
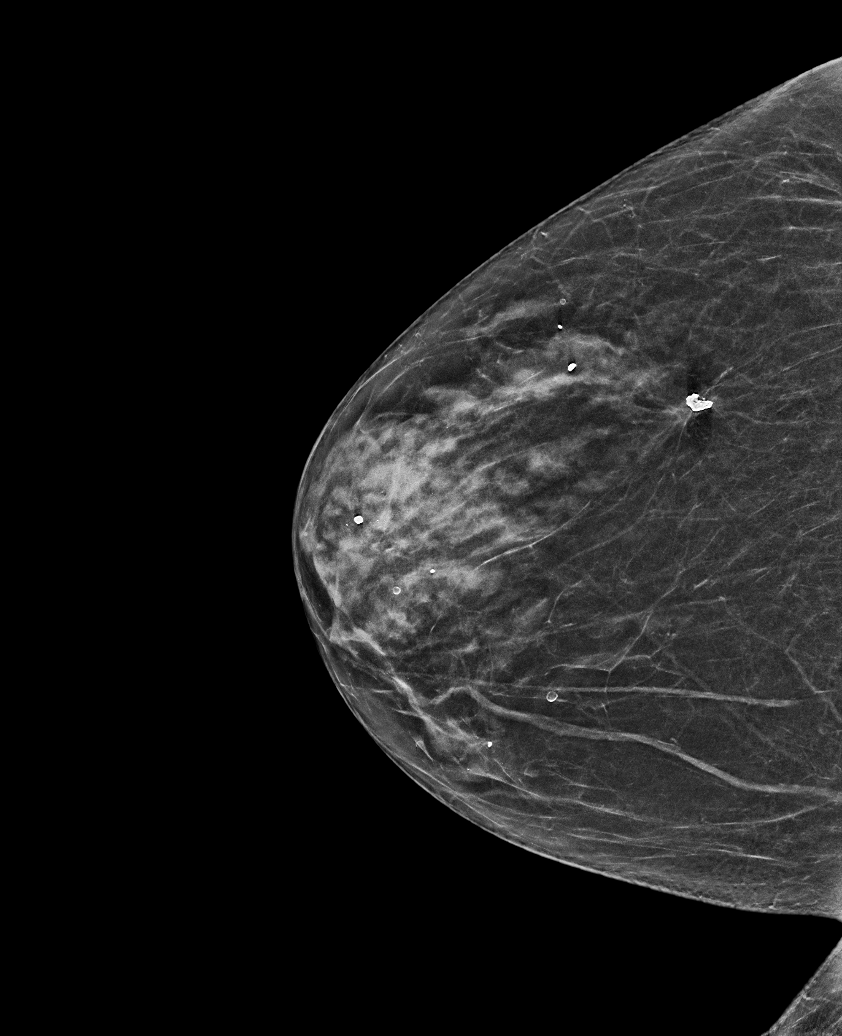

[L CC synth-2D]
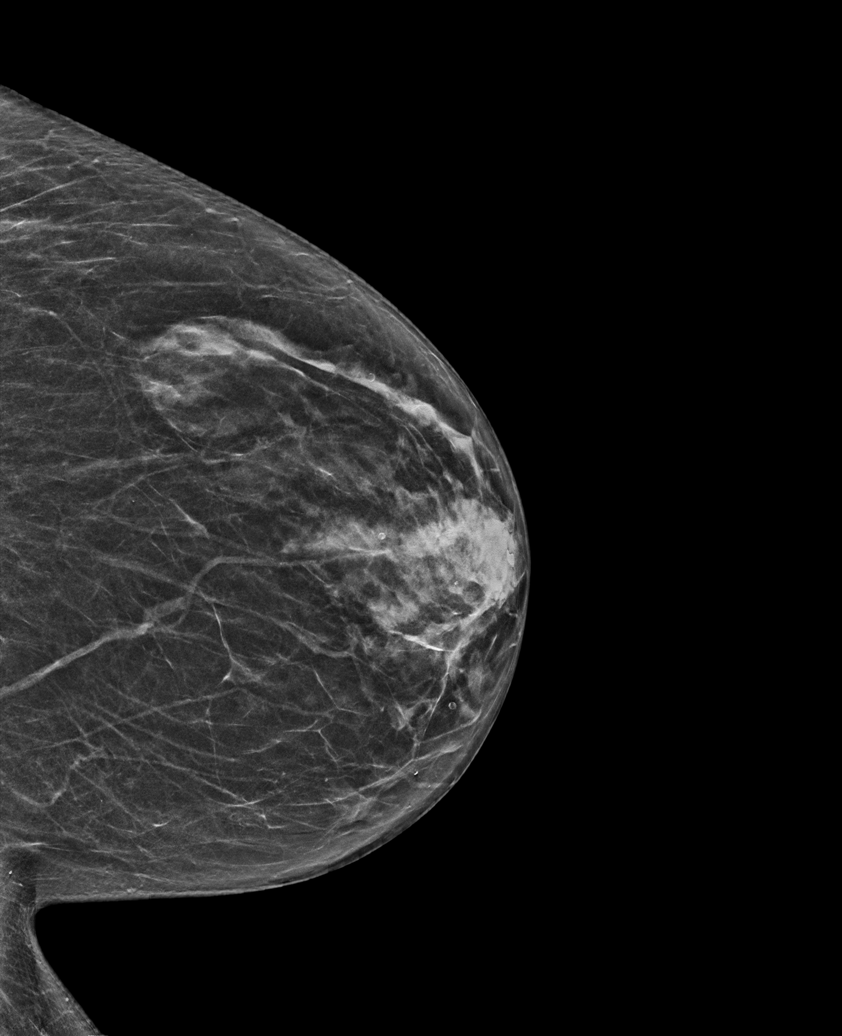

[R CC tomo · tomo slice 29/57.0]
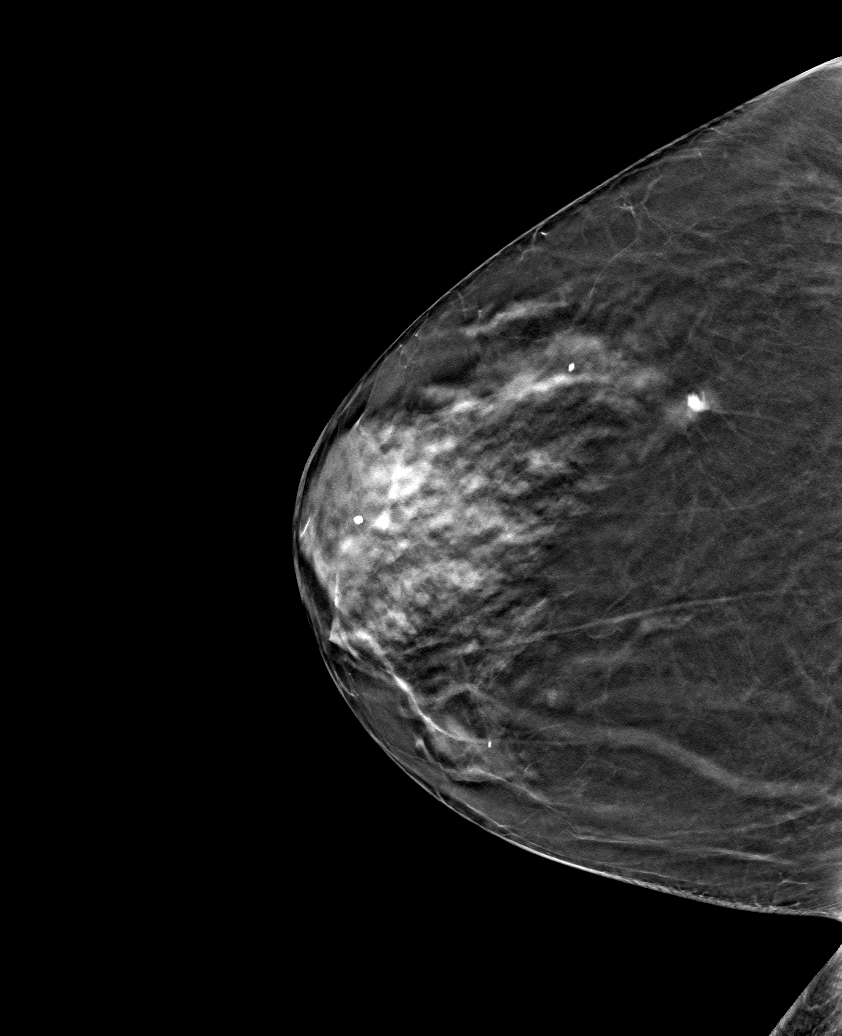

[6 of 30 positions shown; findings below may reference images not displayed]

ACR Breast Density Category c: The breast tissue is heterogeneously
dense, which may obscure small masses.
FINDINGS: There are no findings suspicious for malignancy. Images were
processed with CAD.
IMPRESSION: No mammographic evidence of malignancy. A result letter of this
screening mammogram will be mailed directly to the patient.

RECOMMENDATION:
Screening mammogram in one year. (Code:[id number])

BI-RADS CATEGORY  1: Negative.
